# Patient Record
Sex: Male | Born: 1960 | Race: Black or African American
Health system: Southern US, Community
[De-identification: ages and names within clinical notes are randomized; demographics above are authoritative.]

## PROBLEM LIST (undated history)

## (undated) DIAGNOSIS — I10 Essential (primary) hypertension: Secondary | ICD-10-CM

## (undated) DIAGNOSIS — N400 Enlarged prostate without lower urinary tract symptoms: Secondary | ICD-10-CM

## (undated) DIAGNOSIS — E119 Type 2 diabetes mellitus without complications: Secondary | ICD-10-CM

## (undated) DIAGNOSIS — E785 Hyperlipidemia, unspecified: Secondary | ICD-10-CM

## (undated) HISTORY — DX: Essential (primary) hypertension: I10

## (undated) HISTORY — PX: SKIN GRAFT FULL THICKNESS LEG: SUR1299

---

## 2006-07-23 ENCOUNTER — Emergency Department: Payer: Self-pay | Admitting: Emergency Medicine

## 2008-04-06 ENCOUNTER — Emergency Department: Payer: Self-pay | Admitting: Internal Medicine

## 2010-06-14 ENCOUNTER — Emergency Department: Payer: Self-pay | Admitting: Internal Medicine

## 2012-04-03 ENCOUNTER — Emergency Department: Payer: Self-pay | Admitting: Emergency Medicine

## 2013-08-31 ENCOUNTER — Emergency Department: Payer: Self-pay | Admitting: Emergency Medicine

## 2016-07-08 ENCOUNTER — Emergency Department
Admission: EM | Admit: 2016-07-08 | Discharge: 2016-07-08 | Disposition: A | Discharge status: Home / Self Care | Payer: Self-pay | Attending: Student in an Organized Health Care Education/Training Program | Admitting: Student in an Organized Health Care Education/Training Program

## 2016-07-08 ENCOUNTER — Encounter: Payer: Self-pay | Admitting: Emergency Medicine

## 2016-07-08 ENCOUNTER — Emergency Department: Payer: Self-pay

## 2016-07-08 DIAGNOSIS — Y92009 Unspecified place in unspecified non-institutional (private) residence as the place of occurrence of the external cause: Secondary | ICD-10-CM | POA: Insufficient documentation

## 2016-07-08 DIAGNOSIS — S6710XA Crushing injury of unspecified finger(s), initial encounter: Secondary | ICD-10-CM

## 2016-07-08 DIAGNOSIS — S62632A Displaced fracture of distal phalanx of right middle finger, initial encounter for closed fracture: Secondary | ICD-10-CM | POA: Insufficient documentation

## 2016-07-08 DIAGNOSIS — Y9389 Activity, other specified: Secondary | ICD-10-CM | POA: Insufficient documentation

## 2016-07-08 DIAGNOSIS — Z87891 Personal history of nicotine dependence: Secondary | ICD-10-CM | POA: Insufficient documentation

## 2016-07-08 DIAGNOSIS — S62632B Displaced fracture of distal phalanx of right middle finger, initial encounter for open fracture: Secondary | ICD-10-CM

## 2016-07-08 DIAGNOSIS — Y999 Unspecified external cause status: Secondary | ICD-10-CM | POA: Insufficient documentation

## 2016-07-08 DIAGNOSIS — W231XXA Caught, crushed, jammed, or pinched between stationary objects, initial encounter: Secondary | ICD-10-CM | POA: Insufficient documentation

## 2016-07-08 MED ORDER — CEPHALEXIN 500 MG PO CAPS: 500.0000 mg | ORAL_CAPSULE | Freq: Three times a day (TID) | ORAL | 0 refills | Status: DC

## 2016-07-08 MED ORDER — LIDOCAINE HCL (PF) 1 % IJ SOLN
5.0000 mL | Freq: Once | INTRAMUSCULAR | Status: DC
  Filled 2016-07-08: qty 5

## 2016-07-08 MED ORDER — OXYCODONE-ACETAMINOPHEN 5-325 MG PO TABS: 1.0000 | ORAL_TABLET | Freq: Four times a day (QID) | ORAL | 0 refills | Status: AC | PRN

## 2016-07-08 MED ORDER — BUPIVACAINE HCL (PF) 0.5 % IJ SOLN
INTRAMUSCULAR | Status: AC
  Filled 2016-07-08: qty 30

## 2016-07-08 MED ORDER — OXYCODONE-ACETAMINOPHEN 5-325 MG PO TABS
1.0000 | ORAL_TABLET | Freq: Once | ORAL | Status: AC
  Administered 2016-07-08: 1 via ORAL
  Filled 2016-07-08: qty 1

## 2016-07-08 NOTE — Consult Note (Signed)
ORTHOPAEDIC CONSULTATION  REQUESTING PHYSICIAN: Willy EddyPatrick Robinson, MD  Chief Complaint:   Right ring finger injury.  History of Present Illness: Jason Nash is a 56 y.o. male otherwise good health who was helping his friend change the brakes on his friend's vehicle when his finger got stuck while trying to remove a jammed caliper. He presented to the emergency room where x-rays demonstrated a transverse fracture of the distal phalanx just proximal to the tuft with a dorsal laceration. The patient denies any other injuries, and denies any lightheadedness, dizziness, chest pain, shortness breath, or other symptoms that may have precipitated this injury.  History reviewed. No pertinent past medical history. History reviewed. No pertinent surgical history. Social History   Social History  . Marital status: Single    Spouse name: N/A  . Number of children: N/A  . Years of education: N/A   Social History Main Topics  . Smoking status: Former Games developermoker  . Smokeless tobacco: Never Used  . Alcohol use No  . Drug use: No  . Sexual activity: Not Asked   Other Topics Concern  . None   Social History Narrative  . None   History reviewed. No pertinent family history. No Known Allergies Prior to Admission medications   Not on File   Dg Finger Ring Right  Result Date: 07/08/2016 CLINICAL DATA:  Crush injury to right ring finger today. Initial encounter. EXAM: RIGHT RING FINGER 2+V COMPARISON:  None. FINDINGS: A mildly comminuted fracture of the mid-distal aspect of the distal phalanx is noted with 2 mm palmar displacement. There is no evidence of subluxation or dislocation. Tiny radiopaque foreign bodies along the skin or noted. IMPRESSION: Mildly comminuted fracture of the distal phalanx with 2 mm palmar displacement. Electronically Signed   By: Harmon PierJeffrey  Hu M.D.   On: 07/08/2016 13:49    Positive ROS: All other systems have  been reviewed and were otherwise negative with the exception of those mentioned in the HPI and as above.  Physical Exam: General:  Alert, no acute distress Psychiatric:  Patient is competent for consent with normal mood and affect   Cardiovascular:  No pedal edema Respiratory:  No wheezing, non-labored breathing GI:  Abdomen is soft and non-tender Skin:  No lesions in the area of chief complaint Neurologic:  Sensation intact distally Lymphatic:  No axillary or cervical lymphadenopathy  Orthopedic Exam:  Orthopedic examination is limited to the right hand. There is a primarily transverse laceration over the dorsal and dorsal ulnar aspect of the ring finger between the extensor crease and the nailbed measuring approximate 1.5 cm. The nail plate itself is intact, as is the volar surface of the skin. He has good capillary refill to the fingertip and has intact sensation to light touch to the fingertip.  X-rays:  X-rays of the right ring finger are available for review. These films demonstrate a mildly displaced transverse fracture of the midshaft of the distal phalanx. No other bony abnormalities are identified.  Assessment: Open distal phalanx fracture right ring finger.  Plan: After obtaining verbal consent, a digital block was placed sterilely using 5 cc of 0.5% Sensorcaine and 5 cc of 1% lidocaine. The wound was irrigated thoroughly with a Betadine/saline solution before the margins of the wound were debrided sharply. The nail was replaced back beneath the nail fold and the fracture reduced. The laceration was repaired using several 4-0 Prolene interrupted sutures. One additional suture was passed through the nail plate to hold it in place. A sterile  bulky dressing with a dorsal AlumaFoam splint was applied to the ring finger. The patient tolerated the procedure well.  The patient is advised to keep the dressing dry and intact. He is to follow-up in my office in 3-5 days, most likely with my  physician assistant, Jason Latino, PA-C, for a dressing change. Please call [phone] on Monday morning to make this appointment. He is to be provided with pain medication and antibiotics by the emergency room provider.   Thank you for ask me to participate in the care of this most pleasant man. I will be happy to keep you abreast of his progress.    Maryagnes Amos, MD  Beeper #:  [phone]  07/08/2016 3:00 PM

## 2016-07-08 NOTE — ED Notes (Signed)
Pt verbalized understanding of discharge instructions. NAD at this time. 

## 2016-07-08 NOTE — ED Triage Notes (Addendum)
Pt to ED from home c/o right 4th finger pain.  Patient states working on car at home changing brakes when the caliber slipped and crushed finger.  Pt states put pressure at home on finger.  Bleeding under control at this time,  Obvious deformity to finger, laceration to skin on distal knuckle.  Pt able to move other fingers on right hand, slight mobility in injured finger.

## 2016-07-08 NOTE — Discharge Instructions (Addendum)
No strong gripping with the right hand.   Keep splint and dressing dry and intact.  Keep right hand elevated above heart level.  Take the antibiotics regularly as prescribed until they run out. Take extra strength Tylenol or ibuprofen as needed for discomfort. May take prescribed pain medication for more severe pain when necessary. Call the office on Monday morning at [phone] to set up a follow-up appointment in 3-5 days with either myself or my physician assistant, Horris LatinoLance McGhee, PA-C.

## 2016-07-08 NOTE — ED Provider Notes (Signed)
[facility] Emergency Department Provider Note  ____________________________________________  Time seen: Approximately 1:39 PM  I have reviewed the triage vital signs and the nursing notes.   HISTORY  Chief Complaint Finger Injury    HPI Jason Nash is a 56 y.o. male , NAD, presents emergency department for evaluation of right middle finger pain and laceration. Patient states he was working on the brake system of a vehicle earlier today when his finger was crushed and the mechanism. States any as laceration and significant pain and swelling about the distal portion of his right middle finger. Has had some numbness and tingling but is not lost complete sensation of the finger. Also notes that his tetanus vaccination was updated within the last 5 years.   History reviewed. No pertinent past medical history.  There are no active problems to display for this patient.   History reviewed. No pertinent surgical history.  Prior to Admission medications   Medication Sig Start Date End Date Taking? Authorizing Provider  cephALEXin (KEFLEX) 500 MG capsule Take 1 capsule (500 mg total) by mouth 3 (three) times daily. 07/08/16   Jami L Hagler, PA-C  oxyCODONE-acetaminophen (ROXICET) 5-325 MG tablet Take 1 tablet by mouth every 6 (six) hours as needed for severe pain. 07/08/16 07/08/17  Jami L Hagler, PA-C    Allergies Patient has no known allergies.  History reviewed. No pertinent family history.  Social History Social History  Substance Use Topics  . Smoking status: Former Games developermoker  . Smokeless tobacco: Never Used  . Alcohol use No     Review of Systems  Constitutional: No fever, fatigue Musculoskeletal: Positive right middle finger pain. No other phalanx pain nor any hand or wrist pain.  Skin: Positive laceration right middle finger and swelling. Negative for bruising, skin sores. Neurological: Positive numbness and tingling right distal phalanx. No  weakness.  ____________________________________________   PHYSICAL EXAM:  VITAL SIGNS: ED Triage Vitals  Enc Vitals Group     BP 07/08/16 1317 (!) 119/91     Pulse Rate 07/08/16 1317 69     Resp 07/08/16 1317 18     Temp 07/08/16 1317 98.5 F (36.9 C)     Temp Source 07/08/16 1317 Oral     SpO2 07/08/16 1317 98 %     Weight 07/08/16 1318 200 lb (90.7 kg)     Height 07/08/16 1318 5\' 7"  (1.702 m)     Head Circumference --      Peak Flow --      Pain Score 07/08/16 1327 9     Pain Loc --      Pain Edu? --      Excl. in GC? --      Constitutional: Alert and oriented. Well appearing and in no acute distress. Eyes: Conjunctivae are normal.  Head: Atraumatic. Cardiovascular: Good peripheral circulation with 2+ pulses noted in the right upper extremity. Respiratory: Normal respiratory effort without tachypnea or retractions.  Musculoskeletal: Tenderness to palpation about the distal right middle phalanx with deformity noted. Full range of motion of all other fingers of the right hand. Full range of motion of the right wrist without pain or difficulty. Neurologic:  Normal speech and language. No gross focal neurologic deficits are appreciated. Sensation to light touch grossly intact about the digits of the right hand. Skin:  Laceration about the dorsal portion of the distal phalanx of the right middle finger. Nailbed is intact and without laceration to the nail. No visualized or palpated  foreign bodies. Skin is warm, dry.  Psychiatric: Mood and affect are normal. Speech and behavior are normal. Patient exhibits appropriate insight and judgement.   ____________________________________________   LABS  None ____________________________________________  EKG  None ____________________________________________  RADIOLOGY I, Hope Pigeon, personally viewed and evaluated these images (plain radiographs) as part of my medical decision making, as well as reviewing the written report  by the radiologist.  Dg Finger Ring Right  Result Date: 07/08/2016 CLINICAL DATA:  Crush injury to right ring finger today. Initial encounter. EXAM: RIGHT RING FINGER 2+V COMPARISON:  None. FINDINGS: A mildly comminuted fracture of the mid-distal aspect of the distal phalanx is noted with 2 mm palmar displacement. There is no evidence of subluxation or dislocation. Tiny radiopaque foreign bodies along the skin or noted. IMPRESSION: Mildly comminuted fracture of the distal phalanx with 2 mm palmar displacement. Electronically Signed   By: Harmon Pier M.D.   On: 07/08/2016 13:49    ____________________________________________    PROCEDURES  Procedure(s) performed: None   Procedures   Medications  lidocaine (PF) (XYLOCAINE) 1 % injection 5 mL (not administered)  bupivacaine (MARCAINE) 0.5 % injection (not administered)  oxyCODONE-acetaminophen (PERCOCET/ROXICET) 5-325 MG per tablet 1 tablet (1 tablet Oral Given 07/08/16 1408)   ____________________________________________   INITIAL IMPRESSION / ASSESSMENT AND PLAN / ED COURSE  Pertinent labs & imaging results that were available during my care of the patient were reviewed by me and considered in my medical decision making (see chart for details).     Patient's diagnosis is consistent with A nondisplaced fracture of the distal phalanx of the right middle finger due to crushing injury. Once x-rays were uploaded, I spoke with Dr. Joice Lofts in orthopedics in regards to the patient's injury. He suggested to complete a digital block, realign the fracture as well as cleanse the finger well. If any lacerations, they should be repaired. Patient was given a tablet of Percocet and as I was preparing the patient for digital block, Dr. Joice Lofts arrived in the emergency department to evaluate the patient. Dr. Joice Lofts completed all procedures including digital block, thorough irrigation of the wound, resetting of the fracture as well as stitching the  laceration. Patient was also placed in a finger splint for support and will follow up with Dr. Binnie Rail office on Wednesday for further evaluation and treatment. Patient will be discharged home with prescriptions for Roxicet and Keflex to take as directed. Patient is advised to keep the right hand elevated and to keep all dressings on the finger over the next few days. Patient is to keep the wound clean and dry until reevaluated on Wednesday. Patient is given ED precautions to return to the ED for any worsening or new symptoms.    ____________________________________________  FINAL CLINICAL IMPRESSION(S) / ED DIAGNOSES  Final diagnoses:  Open displaced fracture of distal phalanx of right middle finger, initial encounter  Crushing injury of finger, initial encounter      NEW MEDICATIONS STARTED DURING THIS VISIT:  Discharge Medication List as of 07/08/2016  3:03 PM    START taking these medications   Details  cephALEXin (KEFLEX) 500 MG capsule Take 1 capsule (500 mg total) by mouth 3 (three) times daily., Starting Sat 07/08/2016, Print    oxyCODONE-acetaminophen (ROXICET) 5-325 MG tablet Take 1 tablet by mouth every 6 (six) hours as needed for severe pain., Starting Sat 07/08/2016, Until Sun 07/08/2017, Print             Jami Jon Gills, PA-C  07/08/16 1547    Willy Eddy, MD 07/08/16 1550

## 2016-12-19 ENCOUNTER — Emergency Department
Admission: EM | Admit: 2016-12-19 | Discharge: 2016-12-19 | Disposition: A | Discharge status: Home / Self Care | Payer: Self-pay | Attending: Emergency Medicine | Admitting: Emergency Medicine

## 2016-12-19 ENCOUNTER — Encounter: Payer: Self-pay | Admitting: Emergency Medicine

## 2016-12-19 DIAGNOSIS — Z87891 Personal history of nicotine dependence: Secondary | ICD-10-CM | POA: Insufficient documentation

## 2016-12-19 DIAGNOSIS — K529 Noninfective gastroenteritis and colitis, unspecified: Secondary | ICD-10-CM | POA: Insufficient documentation

## 2016-12-19 LAB — COMPREHENSIVE METABOLIC PANEL
ALBUMIN: 4.5 g/dL (ref 3.5–5.0)
ALK PHOS: 103 U/L (ref 38–126)
ALT: 22 U/L (ref 17–63)
ANION GAP: 7 (ref 5–15)
AST: 25 U/L (ref 15–41)
BILIRUBIN TOTAL: 1.1 mg/dL (ref 0.3–1.2)
BUN: 21 mg/dL — ABNORMAL HIGH (ref 6–20)
CALCIUM: 9.3 mg/dL (ref 8.9–10.3)
CO2: 26 mmol/L (ref 22–32)
Chloride: 105 mmol/L (ref 101–111)
Creatinine, Ser: 1.18 mg/dL (ref 0.61–1.24)
GFR calc non Af Amer: >60 mL/min (ref >60)
GLUCOSE: 116 mg/dL — AB (ref 65–99)
POTASSIUM: 4.2 mmol/L (ref 3.5–5.1)
SODIUM: 138 mmol/L (ref 135–145)
TOTAL PROTEIN: 8.3 g/dL — AB (ref 6.5–8.1)

## 2016-12-19 LAB — URINALYSIS, COMPLETE (UACMP) WITH MICROSCOPIC
BACTERIA UA: NONE SEEN
BILIRUBIN URINE: NEGATIVE
GLUCOSE, UA: NEGATIVE mg/dL
HGB URINE DIPSTICK: NEGATIVE
KETONES UR: NEGATIVE mg/dL
Nitrite: NEGATIVE
PH: 5 (ref 5.0–8.0)
PROTEIN: NEGATIVE mg/dL
Specific Gravity, Urine: 1.032 — ABNORMAL HIGH (ref 1.005–1.030)

## 2016-12-19 LAB — CBC
HEMATOCRIT: 46.2 % (ref 40.0–52.0)
Hemoglobin: 15.3 g/dL (ref 13.0–18.0)
MCH: 28.7 pg (ref 26.0–34.0)
MCHC: 33.1 g/dL (ref 32.0–36.0)
MCV: 86.9 fL (ref 80.0–100.0)
Platelets: 230 10*3/uL (ref 150–440)
RBC: 5.32 MIL/uL (ref 4.40–5.90)
RDW: 14.2 % (ref 11.5–14.5)
WBC: 9.5 10*3/uL (ref 3.8–10.6)

## 2016-12-19 LAB — LIPASE, BLOOD: Lipase: 22 U/L (ref 11–51)

## 2016-12-19 MED ORDER — LOPERAMIDE HCL 2 MG PO TABS: 2.0000 mg | ORAL_TABLET | Freq: Four times a day (QID) | ORAL | 0 refills | Status: DC | PRN

## 2016-12-19 MED ORDER — ONDANSETRON 4 MG PO TBDP: 4.0000 mg | ORAL_TABLET | Freq: Three times a day (TID) | ORAL | 0 refills | Status: DC | PRN

## 2016-12-19 NOTE — ED Notes (Signed)
AAOx3.  Skin warm and dry.  NAD 

## 2016-12-19 NOTE — ED Triage Notes (Signed)
Pt ambulatory to triage with steady gait, no distress noted. Pt reports emesis and diarrhea since 1800 yesterday afternoon. Per pt he consumed a chicken sandwich from neighbors house for lunch.

## 2016-12-19 NOTE — ED Provider Notes (Signed)
[facility] Emergency Department Provider Note   ____________________________________________    I have reviewed the triage vital signs and the nursing notes.   HISTORY  Chief Complaint Emesis and Diarrhea     HPI Jason Nash is a 56 y.o. male is a 56 y.o. male who presents with diarrhea and nausea and vomiting. He reports the symptoms started overnight and have continued through this morning. He is unclear what causes. Does report that his wife was sick last week with similar symptoms. He denies fevers but has had body aches. Mild abdominal cramping but none currently. No recent travel.   History reviewed. No pertinent past medical history.  There are no active problems to display for this patient.   History reviewed. No pertinent surgical history.  Prior to Admission medications   Medication Sig Start Date End Date Taking? Authorizing Provider  cephALEXin (KEFLEX) 500 MG capsule Take 1 capsule (500 mg total) by mouth 3 (three) times daily. 07/08/16   Hagler, Jami L, PA-C  loperamide (IMODIUM A-D) 2 MG tablet Take 1 tablet (2 mg total) by mouth 4 (four) times daily as needed for diarrhea or loose stools. 12/19/16   Jene Every, MD  ondansetron (ZOFRAN ODT) 4 MG disintegrating tablet Take 1 tablet (4 mg total) by mouth every 8 (eight) hours as needed for nausea or vomiting. 12/19/16   Jene Every, MD  oxyCODONE-acetaminophen (ROXICET) 5-325 MG tablet Take 1 tablet by mouth every 6 (six) hours as needed for severe pain. 07/08/16 07/08/17  Hagler, Jami L, PA-C     Allergies Patient has no known allergies.  History reviewed. No pertinent family history.  Social History Social History  Substance Use Topics  . Smoking status: Former Games developer  . Smokeless tobacco: Never Used  . Alcohol use No    Review of Systems  Constitutional: No fever/chills Eyes: No visual changes.  ENT: No sore throat. Cardiovascular: Denies chest  pain. Respiratory: Denies shortness of breath. Gastrointestinal: As above Genitourinary: Negative for dysuria. Musculoskeletal: Negative for back pain. Skin: Negative for rash. Neurological: Negative for headaches or weakness   ____________________________________________   PHYSICAL EXAM:  VITAL SIGNS: ED Triage Vitals [12/19/16 0650]  Enc Vitals Group     BP (!) 179/96     Pulse Rate 73     Resp 16     Temp 97.8 F (36.6 C)     Temp Source Oral     SpO2 99 %     Weight 90.7 kg (200 lb)     Height      Head Circumference      Peak Flow      Pain Score      Pain Loc      Pain Edu?      Excl. in GC?     Constitutional: Alert and oriented. No acute distress. Pleasant and interactive Eyes: Conjunctivae are normal.  Head: Atraumatic. Nose: No congestion/rhinnorhea. Mouth/Throat: Mucous membranes are moist.   Neck:  Painless ROM Cardiovascular: Normal rate, regular rhythm. Grossly normal heart sounds.  Good peripheral circulation. Respiratory: Normal respiratory effort.  No retractions. Lungs CTAB. Gastrointestinal: Soft and nontender. No distention.  No CVA tenderness. Genitourinary: deferred Musculoskeletal: No lower extremity tenderness nor edema.  Warm and well perfused Neurologic:  Normal speech and language. No gross focal neurologic deficits are appreciated.  Skin:  Skin is warm, dry and intact. No rash noted. Psychiatric: Mood and affect are normal. Speech and behavior are normal.  ____________________________________________  LABS (all labs ordered are listed, but only abnormal results are displayed)  Labs Reviewed  COMPREHENSIVE METABOLIC PANEL - Abnormal; Notable for the following:       Result Value   Glucose, Bld 116 (*)    BUN 21 (*)    Total Protein 8.3 (*)    All other components within normal limits  URINALYSIS, COMPLETE (UACMP) WITH MICROSCOPIC - Abnormal; Notable for the following:    Color, Urine YELLOW (*)    APPearance HAZY (*)     Specific Gravity, Urine 1.032 (*)    Leukocytes, UA TRACE (*)    Squamous Epithelial / LPF 0-5 (*)    All other components within normal limits  LIPASE, BLOOD  CBC   ____________________________________________  EKG  None ____________________________________________  RADIOLOGY  None ____________________________________________   PROCEDURES  Procedure(s) performed: No    Critical Care performed: No ____________________________________________   INITIAL IMPRESSION / ASSESSMENT AND PLAN / ED COURSE  Pertinent labs & imaging results that were available during my care of the patient were reviewed by me and considered in my medical decision making (see chart for details).  Patient well-appearing and in no acute distress. Vital signs are unremarkable. Lab work is reassuring. Recommend continue hydration, symptomatic treatment for likely viral gastroenteritis    ____________________________________________   FINAL CLINICAL IMPRESSION(S) / ED DIAGNOSES  Final diagnoses:  Gastroenteritis      NEW MEDICATIONS STARTED DURING THIS VISIT:  Discharge Medication List as of 12/19/2016  9:20 AM    START taking these medications   Details  loperamide (IMODIUM A-D) 2 MG tablet Take 1 tablet (2 mg total) by mouth 4 (four) times daily as needed for diarrhea or loose stools., Starting Tue 12/19/2016, Print    ondansetron (ZOFRAN ODT) 4 MG disintegrating tablet Take 1 tablet (4 mg total) by mouth every 8 (eight) hours as needed for nausea or vomiting., Starting Tue 12/19/2016, Print         Note:  This document was prepared using Dragon voice recognition software and may include unintentional dictation errors.    Jene EveryKinner, Blasa Raisch, MD 12/19/16 [phone]

## 2017-11-03 ENCOUNTER — Other Ambulatory Visit: Payer: Self-pay

## 2017-11-03 ENCOUNTER — Emergency Department
Admission: EM | Admit: 2017-11-03 | Discharge: 2017-11-03 | Disposition: A | Discharge status: Home / Self Care | Payer: Self-pay | Attending: Emergency Medicine | Admitting: Emergency Medicine

## 2017-11-03 ENCOUNTER — Encounter: Payer: Self-pay | Admitting: Physician Assistant

## 2017-11-03 DIAGNOSIS — Y929 Unspecified place or not applicable: Secondary | ICD-10-CM | POA: Insufficient documentation

## 2017-11-03 DIAGNOSIS — Z87891 Personal history of nicotine dependence: Secondary | ICD-10-CM | POA: Insufficient documentation

## 2017-11-03 DIAGNOSIS — S0006XA Insect bite (nonvenomous) of scalp, initial encounter: Secondary | ICD-10-CM | POA: Insufficient documentation

## 2017-11-03 DIAGNOSIS — W57XXXA Bitten or stung by nonvenomous insect and other nonvenomous arthropods, initial encounter: Secondary | ICD-10-CM | POA: Insufficient documentation

## 2017-11-03 DIAGNOSIS — Y939 Activity, unspecified: Secondary | ICD-10-CM | POA: Insufficient documentation

## 2017-11-03 DIAGNOSIS — Y998 Other external cause status: Secondary | ICD-10-CM | POA: Insufficient documentation

## 2017-11-03 MED ORDER — CEPHALEXIN 500 MG PO CAPS
500.0000 mg | ORAL_CAPSULE | Freq: Once | ORAL | Status: AC
  Administered 2017-11-03: 500 mg via ORAL
  Filled 2017-11-03: qty 1

## 2017-11-03 MED ORDER — CEPHALEXIN 500 MG PO CAPS: 500.0000 mg | ORAL_CAPSULE | Freq: Three times a day (TID) | ORAL | 0 refills | Status: DC

## 2017-11-03 NOTE — ED Notes (Signed)
Patient thinks he was bitten by a spider on the top of his head. Believes it occurred on Monday. Has been using alcohol on it since then without improvement.

## 2017-11-03 NOTE — ED Notes (Signed)
Patient with small area to top of scalp that he thinks is an insect bit. Patient states that he noticed Monday. Patient states that it has become larger and more tender since Monday.

## 2017-11-03 NOTE — ED Provider Notes (Signed)
[name] Emergency Department Provider Note ____________________________________________  Time seen: 2245  I have reviewed the triage vital signs and the nursing notes.  HISTORY  Chief Complaint  Insect Bite  HPI Jason Nash is a 57 y.o. male presents to the ED accompanied by his wife, for evaluation of a tender, swollen area to the posterior scalp.  Patient describes on Monday, after work, he began to experience some tenderness and swelling to the scalp.  He is unclear of what insect might have bitten or stung him.  He reports since that time he had a focal area of swelling and tightness to the scalp.  He also reports a scab in the same area.  No spontaneous drainage is reported.  Patient also denies any interim fevers, chills, sweats.  History reviewed. No pertinent past medical history.  There are no active problems to display for this patient.  History reviewed. No pertinent surgical history.  Prior to Admission medications   Medication Sig Start Date End Date Taking? Authorizing Provider  cephALEXin (KEFLEX) 500 MG capsule Take 1 capsule (500 mg total) by mouth 3 (three) times daily. 11/03/17   Dafney Farler, Charlesetta Ivory, PA-C    Allergies Patient has no known allergies.  No family history on file.  Social History Social History   Tobacco Use  . Smoking status: Former Games developer  . Smokeless tobacco: Never Used  Substance Use Topics  . Alcohol use: No  . Drug use: No    Review of Systems  Constitutional: Negative for fever. Eyes: Negative for visual changes. ENT: Negative for sore throat. Cardiovascular: Negative for chest pain. Respiratory: Negative for shortness of breath. Gastrointestinal: Negative for abdominal pain, vomiting and diarrhea. Musculoskeletal: Negative for back pain. Skin: Negative for rash. Scalp insect bite Neurological: Negative for headaches, focal weakness or  numbness. ____________________________________________  PHYSICAL EXAM:  VITAL SIGNS: ED Triage Vitals  Enc Vitals Group     BP 11/03/17 2049 (!) 169/91     Pulse Rate 11/03/17 2049 92     Resp 11/03/17 2049 20     Temp 11/03/17 2049 98.3 F (36.8 C)     Temp Source 11/03/17 2049 Oral     SpO2 11/03/17 2049 97 %     Weight 11/03/17 2048 200 lb (90.7 kg)     Height 11/03/17 2048  (1.753 m)     Head Circumference --      Peak Flow --      Pain Score 11/03/17 2302 0     Pain Loc --      Pain Edu? --      Excl. in GC? --     Constitutional: Alert and oriented. Well appearing and in no distress. Head: Normocephalic and atraumatic.  Patient with a focal area of induration and pointing, surrounding a small central scab.  The scab was lifted with an alcohol swab to reveal a scant amount of purulent drainage. Eyes: Conjunctivae are normal. PERRL. Normal extraocular movements Neck: Supple. No thyromegaly. Hematological/Lymphatic/Immunological: No cervical lymphadenopathy. Cardiovascular: Normal rate, regular rhythm. Normal distal pulses. Respiratory: Normal respiratory effort. No wheezes/rales/rhonchi. Musculoskeletal: Nontender with normal range of motion in all extremities.  Neurologic:  Normal gait without ataxia. Normal speech and language. No gross focal neurologic deficits are appreciated. Skin:  Skin is warm, dry and intact. No rash noted. Psychiatric: Mood and affect are normal. Patient exhibits appropriate insight and judgment. ___________________________________________  PROCEDURES  Procedures Keflex 500 mg PO ____________________________________________  INITIAL IMPRESSION /  ASSESSMENT AND PLAN / ED COURSE  Patient with ED evaluation of a presumed insect bite to the scalp.  Patient presents with some local signs of infection.  He will be discharged with a prescription for Keflex to dose as directed.  He is encouraged to keep the area clean, dry, and cover with a  small amount of antibiotic ointment.  Return precautions have been reviewed.  He and his wife attempted to ask questions and are comfortable with discharge plan. ____________________________________________  FINAL CLINICAL IMPRESSION(S) / ED DIAGNOSES  Final diagnoses:  Insect bite of scalp, initial encounter  Bug bite with infection, initial encounter      Lissa Hoard, PA-C 11/03/17 2334    Jeanmarie Plant, MD 11/03/17 2358

## 2017-11-03 NOTE — ED Triage Notes (Signed)
Reports has area on scalp thinks possible spider bite, happened Monday.

## 2017-11-03 NOTE — Discharge Instructions (Addendum)
You are being treated for a minor infection to the scalp after an insect bite. There is no signs of a serious infection. Take the antibiotic as directed. Keep the scalp clean and covered with a small amount of antibiotic ointment.

## 2020-02-09 ENCOUNTER — Ambulatory Visit: Payer: Self-pay | Attending: Internal Medicine

## 2020-02-09 DIAGNOSIS — Z23 Encounter for immunization: Secondary | ICD-10-CM

## 2020-02-09 NOTE — Progress Notes (Signed)
   Covid-19 Vaccination Clinic  Name:  Jason Nash    MRN: [id number] DOB: [birth year]  02/09/2020  Mr. Tagg was observed post Covid-19 immunization for 15 minutes without incident. He was provided with Vaccine Information Sheet and instruction to access the V-Safe system.   Mr. Luffman was instructed to call 911 with any severe reactions post vaccine: Marland Kitchen Difficulty breathing  . Swelling of face and throat  . A fast heartbeat  . A bad rash all over body  . Dizziness and weakness   Immunizations Administered    Name Date Dose VIS Date Route   Pfizer COVID-19 Vaccine 02/09/2020  4:25 PM 0.3 mL 08/06/2018 Intramuscular   Manufacturer: [employer], [employer]   Lot: [id number]   NDC: [address]-4

## 2020-03-01 ENCOUNTER — Ambulatory Visit: Payer: Self-pay | Attending: Internal Medicine

## 2020-03-01 DIAGNOSIS — Z23 Encounter for immunization: Secondary | ICD-10-CM

## 2020-03-01 NOTE — Progress Notes (Signed)
   Covid-19 Vaccination Clinic  Name:  Jason Nash    MRN: [id number] DOB: 1961/02/24  03/01/2020  Mr. Jason Nash was observed post Covid-19 immunization for 15 minutes without incident. He was provided with Vaccine Information Sheet and instruction to access the V-Safe system.   Mr. Jason Nash was instructed to call 911 with any severe reactions post vaccine: Marland Kitchen Difficulty breathing  . Swelling of face and throat  . A fast heartbeat  . A bad rash all over body  . Dizziness and weakness   Immunizations Administered    Name Date Dose VIS Date Route   Pfizer COVID-19 Vaccine 03/01/2020  4:17 PM 0.3 mL 08/06/2018 Intramuscular   Manufacturer: [employer], [employer]   Lot: [id number]   NDC: [address]-7

## 2020-06-18 ENCOUNTER — Other Ambulatory Visit: Payer: Self-pay

## 2020-06-18 ENCOUNTER — Emergency Department
Admission: EM | Admit: 2020-06-18 | Discharge: 2020-06-18 | Disposition: A | Discharge status: Home / Self Care | Payer: Self-pay | Attending: Emergency Medicine | Admitting: Emergency Medicine

## 2020-06-18 ENCOUNTER — Emergency Department: Payer: Self-pay

## 2020-06-18 DIAGNOSIS — R1030 Lower abdominal pain, unspecified: Secondary | ICD-10-CM | POA: Insufficient documentation

## 2020-06-18 DIAGNOSIS — Z87891 Personal history of nicotine dependence: Secondary | ICD-10-CM | POA: Insufficient documentation

## 2020-06-18 DIAGNOSIS — N400 Enlarged prostate without lower urinary tract symptoms: Secondary | ICD-10-CM

## 2020-06-18 DIAGNOSIS — N401 Enlarged prostate with lower urinary tract symptoms: Secondary | ICD-10-CM | POA: Insufficient documentation

## 2020-06-18 LAB — URINALYSIS, COMPLETE (UACMP) WITH MICROSCOPIC
Bacteria, UA: NONE SEEN
Bilirubin Urine: NEGATIVE
Glucose, UA: NEGATIVE mg/dL
Ketones, ur: NEGATIVE mg/dL
Nitrite: NEGATIVE
Protein, ur: NEGATIVE mg/dL
Specific Gravity, Urine: 1.028 (ref 1.005–1.030)
pH: 5 (ref 5.0–8.0)

## 2020-06-18 LAB — COMPREHENSIVE METABOLIC PANEL
ALT: 21 U/L (ref 0–44)
AST: 21 U/L (ref 15–41)
Albumin: 4.1 g/dL (ref 3.5–5.0)
Alkaline Phosphatase: 104 U/L (ref 38–126)
Anion gap: 8 (ref 5–15)
BUN: 18 mg/dL (ref 6–20)
CO2: 25 mmol/L (ref 22–32)
Calcium: 8.9 mg/dL (ref 8.9–10.3)
Chloride: 104 mmol/L (ref 98–111)
Creatinine, Ser: 1.05 mg/dL (ref 0.61–1.24)
GFR, Estimated: >60 mL/min (ref >60)
Glucose, Bld: 94 mg/dL (ref 70–99)
Potassium: 4.3 mmol/L (ref 3.5–5.1)
Sodium: 137 mmol/L (ref 135–145)
Total Bilirubin: 0.8 mg/dL (ref 0.3–1.2)
Total Protein: 7.9 g/dL (ref 6.5–8.1)

## 2020-06-18 LAB — CBC
HCT: 42.9 % (ref 39.0–52.0)
Hemoglobin: 14 g/dL (ref 13.0–17.0)
MCH: 28.5 pg (ref 26.0–34.0)
MCHC: 32.6 g/dL (ref 30.0–36.0)
MCV: 87.2 fL (ref 80.0–100.0)
Platelets: 235 10*3/uL (ref 150–400)
RBC: 4.92 MIL/uL (ref 4.22–5.81)
RDW: 13.2 % (ref 11.5–15.5)
WBC: 7.8 10*3/uL (ref 4.0–10.5)
nRBC: 0 % (ref 0.0–0.2)

## 2020-06-18 LAB — CK: Total CK: 311 U/L (ref 49–397)

## 2020-06-18 LAB — LACTIC ACID, PLASMA: Lactic Acid, Venous: 0.9 mmol/L (ref 0.5–1.9)

## 2020-06-18 LAB — LIPASE, BLOOD: Lipase: 22 U/L (ref 11–51)

## 2020-06-18 MED ORDER — HYDROMORPHONE HCL 1 MG/ML IJ SOLN
0.5000 mg | Freq: Once | INTRAMUSCULAR | Status: AC
  Administered 2020-06-18: 0.5 mg via INTRAVENOUS
  Filled 2020-06-18: qty 1

## 2020-06-18 MED ORDER — ONDANSETRON HCL 4 MG/2ML IJ SOLN
4.0000 mg | Freq: Once | INTRAMUSCULAR | Status: AC
  Administered 2020-06-18: 4 mg via INTRAVENOUS
  Filled 2020-06-18: qty 2

## 2020-06-18 MED ORDER — IOHEXOL 350 MG/ML SOLN
100.0000 mL | Freq: Once | INTRAVENOUS | Status: AC | PRN
  Administered 2020-06-18: 100 mL via INTRAVENOUS
  Filled 2020-06-18: qty 100

## 2020-06-18 NOTE — Discharge Instructions (Addendum)
Take Tylenol 1 g every 8 hours and ibuprofen 600 every 6 hours. Return to the ER if you develop worsening symptoms.  You to follow-up with your primary care doctor for your prostate to be started on medications IMPRESSION:  1. No acute abnormality.  2. Mild diffuse bladder wall thickening, compatible with chronic  bladder outlet obstruction by the mildly enlarged prostate gland.  3. Mild colonic diverticulosis.

## 2020-06-18 NOTE — ED Provider Notes (Signed)
[facility] Emergency Department Provider Note  ____________________________________________   Event Date/Time   First MD Initiated Contact with Patient 06/18/20 1428     (approximate)  I have reviewed the triage vital signs and the nursing notes.   HISTORY  Chief Complaint Abdominal Pain    HPI Jason Nash is a 60 y.o. male who is otherwise healthy comes in for abdominal pain.  Patient reports 3 days of abdominal pain most in the lower abdomen and goes up into the right upper abdomen.  Pain is severe, constant, nothing makes it better, nothing makes it worse.  Denies ever having abdominal surgeries before.  States that he had 1 episode of loose stool today.  He denies any chest pain, shortness of breath.  He reports some cramping in his legs bilaterally.           History reviewed. No pertinent past medical history.  There are no problems to display for this patient.   History reviewed. No pertinent surgical history.  Prior to Admission medications   Medication Sig Start Date End Date Taking? Authorizing Provider  cephALEXin (KEFLEX) 500 MG capsule Take 1 capsule (500 mg total) by mouth 3 (three) times daily. 11/03/17   Menshew, Dannielle Karvonen, PA-C    Allergies Patient has no known allergies.  No family history on file.  Social History Social History   Tobacco Use  . Smoking status: Former Research scientist (life sciences)  . Smokeless tobacco: Never Used  Substance Use Topics  . Alcohol use: No  . Drug use: No      Review of Systems Constitutional: Positive chills Eyes: No visual changes. ENT: No sore throat. Cardiovascular: Denies chest pain. Respiratory: Denies shortness of breath. Gastrointestinal: Positive abdominal pain, nausea , episode of loose stool Genitourinary: Negative for dysuria. Musculoskeletal: Negative for back pain. Skin: Negative for rash. Neurological: Negative for headaches, focal weakness or numbness. All other ROS  negative ____________________________________________   PHYSICAL EXAM:  VITAL SIGNS: ED Triage Vitals  Enc Vitals Group     BP 06/18/20 1333 (!) 155/91     Pulse Rate 06/18/20 1332 85     Resp 06/18/20 1332 17     Temp 06/18/20 1332 99.9 F (37.7 C)     Temp Source 06/18/20 1332 Oral     SpO2 06/18/20 1332 99 %     Weight 06/18/20 1333 210 lb (95.3 kg)     Height 06/18/20 1333 5\' 7"  (1.702 m)     Head Circumference --      Peak Flow --      Pain Score 06/18/20 1333 8     Pain Loc --      Pain Edu? --      Excl. in [place]? --     Constitutional: Alert and oriented. Well appearing and in no acute distress. Eyes: Conjunctivae are normal. EOMI. Head: Atraumatic. Nose: No congestion/rhinnorhea. Mouth/Throat: Mucous membranes are moist.   Neck: No stridor. Trachea Midline. FROM Cardiovascular: Normal rate, regular rhythm. Grossly normal heart sounds.  Good peripheral circulation. Respiratory: Normal respiratory effort.  No retractions. Lungs CTAB. Gastrointestinal: Tender mostly in his lower abdomen but also in his right upper quadrant.  No distention. No abdominal bruits.  Musculoskeletal: No lower extremity tenderness nor edema.  No joint effusions. Equal strength in his legs. No discoloration noted. Warm well perfused. Neurologic:  Normal speech and language. No gross focal neurologic deficits are appreciated.  Skin:  Skin is warm, dry and intact. No rash  noted. Psychiatric: Mood and affect are normal. Speech and behavior are normal. GU: Deferred   ____________________________________________   LABS (all labs ordered are listed, but only abnormal results are displayed)  Labs Reviewed  URINALYSIS, COMPLETE (UACMP) WITH MICROSCOPIC - Abnormal; Notable for the following components:      Result Value   Color, Urine YELLOW (*)    APPearance CLEAR (*)    Hgb urine dipstick SMALL (*)    Leukocytes,Ua TRACE (*)    All other components within normal limits  LIPASE, BLOOD   COMPREHENSIVE METABOLIC PANEL  CBC  LACTIC ACID, PLASMA  LACTIC ACID, PLASMA   ____________________________________________  RADIOLOGY   Official radiology report(s): CT ABDOMEN PELVIS W CONTRAST  Result Date: 06/18/2020 CLINICAL DATA:  Right upper quadrant abdominal pain radiating to the back with diarrhea for the past 3 days. EXAM: CT ABDOMEN AND PELVIS WITH CONTRAST TECHNIQUE: Multidetector CT imaging of the abdomen and pelvis was performed using the standard protocol following bolus administration of intravenous contrast. CONTRAST:  OMNIPAQUE IOHEXOL 350 MG/ML SOLN COMPARISON:  None. FINDINGS: Lower chest: Minimal bilateral dependent atelectasis. Mildly enlarged heart. Hepatobiliary: No focal liver abnormality is seen. No gallstones, gallbladder wall thickening, or biliary dilatation. Pancreas: Unremarkable. No pancreatic ductal dilatation or surrounding inflammatory changes. Spleen: Normal in size without focal abnormality. Adrenals/Urinary Tract: Mild diffuse bladder wall thickening. Small cyst in each kidney. Normal appearing adrenal glands and ureters. Stomach/Bowel: Small number of colon diverticula without evidence of diverticulitis. Normal appearing appendix, stomach and small bowel. Vascular/Lymphatic: Mild atheromatous arterial calcifications. No enlarged lymph nodes. Reproductive: Minimally enlarged prostate gland. Other: No abdominal wall hernia or abnormality. No abdominopelvic ascites. Musculoskeletal: Lumbar and lower thoracic spine degenerative changes. IMPRESSION: 1. No acute abnormality. 2. Mild diffuse bladder wall thickening, compatible with chronic bladder outlet obstruction by the mildly enlarged prostate gland. 3. Mild colonic diverticulosis. Electronically Signed   By: Beckie Salts M.D.   On: 06/18/2020 15:46    ____________________________________________   PROCEDURES  Procedure(s) performed (including Critical  Care):  Procedures   ____________________________________________   INITIAL IMPRESSION / ASSESSMENT AND PLAN / ED COURSE  Jason Nash was evaluated in Emergency Department on 06/18/2020 for the symptoms described in the history of present illness. He was evaluated in the context of the global COVID-19 pandemic, which necessitated consideration that the patient might be at risk for infection with the SARS-CoV-2 virus that causes COVID-19. Institutional protocols and algorithms that pertain to the evaluation of patients at risk for COVID-19 are in a state of rapid change based on information released by regulatory bodies including the CDC and federal and state organizations. These policies and algorithms were followed during the patient's care in the ED.     Patient is a 60 year old who comes in with some low-grade temperatures at 37.7 with abdominal pain.  Will get CT scan to evaluate for appendicitis, cholecystitis, diverticulitis, SBO.  Not sure what to make of his bilateral leg cramping.  There is no swelling or rash on his legs..  Have good strength.  They are warm and well-perfused.   He has no injury to suggest fracture.  We will add on a CK to make sure no evidence of rhabdo.  Labs are reassuring.  Lactate is normal.  CK is negative.  No white count elevation.  CT scan was negative except concerns for BPH and some obstruction.  We will do a postvoid bladder scan.  Discussed with patient he had COVID back on Christmas.  He  denies any shortness of breath to suggest an infection in his lungs.  Denies any skin source of infection.  No midline back pain to suggest back pain infection.  Unclear his low-grade temperature at this time but suspect more likely viral infection.  Patient is otherwise very well-appearing.  Reevaluated patient is feeling much better.  Post void bladder scan is around 100-200 cc. At this time I do not think he needs a catheter. Did discuss with patient that if he does  not get this followed up with over time he may need a Foley. Patient expressed understanding.   At this time patient's had some low-grade temperatures but otherwise does not meet any sepsis criteria. He is in a normal lactate. He is very well-appearing. He feels uncomfortable with going home and will continue to take Tylenol and ibuprofen for pain and can return to the ER if his symptoms are worsening.     ____________________________________________   FINAL CLINICAL IMPRESSION(S) / ED DIAGNOSES   Final diagnoses:  Enlarged prostate  Lower abdominal pain      MEDICATIONS GIVEN DURING THIS VISIT:  Medications  HYDROmorphone (DILAUDID) injection 0.5 mg (0.5 mg Intravenous Given 06/18/20 1521)  ondansetron (ZOFRAN) injection 4 mg (4 mg Intravenous Given 06/18/20 1521)  iohexol (OMNIPAQUE) 350 MG/ML injection 100 mL (100 mLs Intravenous Contrast Given 06/18/20 1529)     ED Discharge Orders    None       Note:  This document was prepared using Dragon voice recognition software and may include unintentional dictation errors.   Concha Se, MD 06/18/20 [phone]

## 2020-06-18 NOTE — ED Triage Notes (Signed)
Pt c/o RUQ pain that radiates into the back with diarrhea for the past 3 days and also c/o BL knee pain. Pt is in NAD. Ambulatory to triage with no distress noted

## 2020-06-20 LAB — URINE CULTURE: Culture: NO GROWTH

## 2020-12-07 ENCOUNTER — Other Ambulatory Visit: Payer: Self-pay

## 2020-12-07 ENCOUNTER — Emergency Department
Admission: EM | Admit: 2020-12-07 | Discharge: 2020-12-07 | Disposition: A | Discharge status: Home / Self Care | Payer: Self-pay | Attending: Emergency Medicine | Admitting: Emergency Medicine

## 2020-12-07 DIAGNOSIS — K644 Residual hemorrhoidal skin tags: Secondary | ICD-10-CM | POA: Insufficient documentation

## 2020-12-07 DIAGNOSIS — Z87891 Personal history of nicotine dependence: Secondary | ICD-10-CM | POA: Insufficient documentation

## 2020-12-07 DIAGNOSIS — I10 Essential (primary) hypertension: Secondary | ICD-10-CM | POA: Insufficient documentation

## 2020-12-07 MED ORDER — LIDOCAINE HCL URETHRAL/MUCOSAL 2 % EX GEL
1.0000 "application " | Freq: Once | CUTANEOUS | Status: AC
  Administered 2020-12-07: 1
  Filled 2020-12-07: qty 5

## 2020-12-07 MED ORDER — HYDROCORTISONE ACETATE 25 MG RE SUPP: 25.0000 mg | Freq: Two times a day (BID) | RECTAL | 1 refills | Status: DC

## 2020-12-07 MED ORDER — DOCUSATE SODIUM 100 MG PO CAPS: 100.0000 mg | ORAL_CAPSULE | Freq: Two times a day (BID) | ORAL | 2 refills | Status: DC

## 2020-12-07 NOTE — ED Notes (Signed)
See triage note  Presents with possible hemorrhoids   States he noticed pain and some blood yesterday  No relief with OTC meds

## 2020-12-07 NOTE — ED Provider Notes (Signed)
[facility] Emergency Department Provider Note   ____________________________________________   Event Date/Time   First MD Initiated Contact with Patient 12/07/20 1238     (approximate)  I have reviewed the triage vital signs and the nursing notes.   HISTORY  Chief Complaint Hemorrhoids    HPI JAXX HUISH is a 60 y.o. male patient presents with protruding hemorrhoid 2 days.  Patient that this is intermitting problem and he normally can reduce it manually.  Patient also complain about constipation.  Denies rectal bleeding.  Rates pain as a 9/10.  Described pain as "sore".  No palliative measures for complaint.         Past Medical History:  Diagnosis Date   Hypertension     There are no problems to display for this patient.   No past surgical history on file.  Prior to Admission medications   Medication Sig Start Date End Date Taking? Authorizing Provider  docusate sodium (COLACE) 100 MG capsule Take 1 capsule (100 mg total) by mouth 2 (two) times daily. 12/07/20 12/07/21 Yes Joni Reining, PA-C  hydrocortisone (ANUSOL-HC) 25 MG suppository Place 1 suppository (25 mg total) rectally every 12 (twelve) hours. 12/07/20 12/07/21 Yes Joni Reining, PA-C  cephALEXin (KEFLEX) 500 MG capsule Take 1 capsule (500 mg total) by mouth 3 (three) times daily. 11/03/17   Menshew, Charlesetta Ivory, PA-C    Allergies Patient has no known allergies.  No family history on file.  Social History Social History   Tobacco Use   Smoking status: Former    Pack years: 0.00   Smokeless tobacco: Never  Substance Use Topics   Alcohol use: No   Drug use: No    Review of Systems  Constitutional: No fever/chills Eyes: No visual changes. ENT: No sore throat. Cardiovascular: Denies chest pain. Respiratory: Denies shortness of breath. Gastrointestinal: No abdominal pain.  No nausea, no vomiting.  No diarrhea.  No constipation.  Hemorrhoid Genitourinary:  Negative for dysuria. Musculoskeletal: Negative for back pain. Skin: Negative for rash. Neurological: Negative for headaches, focal weakness or numbness. Endocrine: Hypertension  ____________________________________________   PHYSICAL EXAM:  VITAL SIGNS: ED Triage Vitals [12/07/20 1120]  Enc Vitals Group     BP (!) 164/85     Pulse Rate 73     Resp 18     Temp 98.1 F (36.7 C)     Temp Source Oral     SpO2 97 %     Weight 210 lb (95.3 kg)     Height 5\' 9"  (1.753 m)     Head Circumference      Peak Flow      Pain Score 9     Pain Loc      Pain Edu?      Excl. in [place]?    Constitutional: Alert and oriented. Well appearing and in no acute distress. Cardiovascular: Normal rate, regular rhythm. Grossly normal heart sounds.  Good peripheral circulation.  Elevated blood pressure Respiratory: Normal respiratory effort.  No retractions. Lungs CTAB. Gastrointestinal: Soft and nontender. No distention. No abdominal bruits. No CVA tenderness.  Nonthrombosed hemorrhoidal tissue Skin:  Skin is warm, dry and intact. No rash noted. Psychiatric: Mood and affect are normal. Speech and behavior are normal.  ____________________________________________   LABS (all labs ordered are listed, but only abnormal results are displayed)  Labs Reviewed - No data to display ____________________________________________  EKG   ____________________________________________  RADIOLOGY , personally viewed and  evaluated these images (plain radiographs) as part of my medical decision making, as well as reviewing the written report by the radiologist.  ED MD interpretation:    Official radiology report(s): No results found.  ____________________________________________   PROCEDURES  Procedure(s) performed (including Critical Care):  Procedures   ____________________________________________   INITIAL IMPRESSION / ASSESSMENT AND PLAN / ED COURSE  As part of my medical  decision making, I reviewed the following data within the electronic MEDICAL RECORD NUMBER         Patient presents with nonthrombosed rectal hemorrhoid.  Annual reduction performed.  Patient given discharge care instructions and a prescription for Anusol and Colace.  Patient advised follow gastroenterologist if condition persists.     ____________________________________________   FINAL CLINICAL IMPRESSION(S) / ED DIAGNOSES  Final diagnoses:  External hemorrhoid     ED Discharge Orders          Ordered    hydrocortisone (ANUSOL-HC) 25 MG suppository  Every 12 hours        12/07/20 1249    docusate sodium (COLACE) 100 MG capsule  2 times daily        12/07/20 1249             Note:  This document was prepared using Dragon voice recognition software and may include unintentional dictation errors.    Jahsiah, Carpenter, PA-C 12/07/20 1253    Merwyn Katos, MD 12/07/20 1540

## 2020-12-07 NOTE — ED Triage Notes (Signed)
Pt to ED for hemorrhoids, no other complaints. Ambulatory, NAD noted

## 2020-12-07 NOTE — Discharge Instructions (Addendum)
Read and follow discharge care instruction.  Take medication as directed. 

## 2021-02-01 ENCOUNTER — Other Ambulatory Visit: Payer: Self-pay

## 2021-02-01 ENCOUNTER — Emergency Department: Payer: Self-pay

## 2021-02-01 ENCOUNTER — Encounter: Payer: Self-pay | Admitting: Emergency Medicine

## 2021-02-01 ENCOUNTER — Emergency Department
Admission: EM | Admit: 2021-02-01 | Discharge: 2021-02-01 | Disposition: A | Discharge status: Home / Self Care | Payer: Self-pay | Attending: Emergency Medicine | Admitting: Emergency Medicine

## 2021-02-01 DIAGNOSIS — I10 Essential (primary) hypertension: Secondary | ICD-10-CM | POA: Insufficient documentation

## 2021-02-01 DIAGNOSIS — Z87891 Personal history of nicotine dependence: Secondary | ICD-10-CM | POA: Insufficient documentation

## 2021-02-01 DIAGNOSIS — M25512 Pain in left shoulder: Secondary | ICD-10-CM | POA: Insufficient documentation

## 2021-02-01 DIAGNOSIS — M5412 Radiculopathy, cervical region: Secondary | ICD-10-CM | POA: Insufficient documentation

## 2021-02-01 MED ORDER — MELOXICAM 15 MG PO TABS: 15.0000 mg | ORAL_TABLET | Freq: Every day | ORAL | 0 refills | Status: DC

## 2021-02-01 MED ORDER — METHOCARBAMOL 500 MG PO TABS: 500.0000 mg | ORAL_TABLET | Freq: Four times a day (QID) | ORAL | 0 refills | Status: DC

## 2021-02-01 MED ORDER — ORPHENADRINE CITRATE 30 MG/ML IJ SOLN
60.0000 mg | Freq: Once | INTRAMUSCULAR | Status: AC
  Administered 2021-02-01: 60 mg via INTRAMUSCULAR
  Filled 2021-02-01: qty 2

## 2021-02-01 MED ORDER — KETOROLAC TROMETHAMINE 30 MG/ML IJ SOLN
30.0000 mg | Freq: Once | INTRAMUSCULAR | Status: AC
  Administered 2021-02-01: 30 mg via INTRAMUSCULAR
  Filled 2021-02-01: qty 1

## 2021-02-01 NOTE — ED Triage Notes (Signed)
C/O left upper back and neck pain x 3 weeks.  Worse with activity.

## 2021-02-01 NOTE — ED Provider Notes (Signed)
[facility] Emergency Department Provider Note  ____________________________________________  Time seen: Approximately 3:19 PM  I have reviewed the triage vital signs and the nursing notes.   HISTORY  Chief Complaint Neck Pain    HPI Jason Nash is a 60 y.o. male who presents the emergency department complaining of left-sided neck/posterior shoulder pain.  Patient states that he has a sharp sensation like somebody is poking him with a knife or a burning sensation running out of the left side of his neck into the posterior left shoulder.  Patient states that he does repetitive motions for work, lifts heavy items and that this has been a gradual onset of symptoms.  There is no acute injury.  No falls, motor vehicle collisions.  Patient states that this has happened in the past but its been "a long time" since the last episode.  Over-the-counter medications are unsuccessful in resolving the patient's symptoms.  There is no loss of range of motion of the left upper extremity, numbness, tingling of the left upper extremity.  No other complaints at this time.  Patient denies any headache, right-sided neck pain, chest pain, shortness of breath.       Past Medical History:  Diagnosis Date   Hypertension     There are no problems to display for this patient.   History reviewed. No pertinent surgical history.  Prior to Admission medications   Medication Sig Start Date End Date Taking? Authorizing Provider  meloxicam (MOBIC) 15 MG tablet Take 1 tablet (15 mg total) by mouth daily. 02/01/21   Latangela Mccomas, Delorise Royals, PA-C  methocarbamol (ROBAXIN) 500 MG tablet Take 1 tablet (500 mg total) by mouth 4 (four) times daily. 02/01/21   Anastasija Anfinson, Delorise Royals, PA-C    Allergies Patient has no known allergies.  No family history on file.  Social History Social History   Tobacco Use   Smoking status: Former   Smokeless tobacco: Never  Substance Use Topics   Alcohol  use: No   Drug use: No     Review of Systems  Constitutional: No fever/chills Eyes: No visual changes. No discharge ENT: No upper respiratory complaints. Cardiovascular: no chest pain. Respiratory: no cough. No SOB. Gastrointestinal: No abdominal pain.  No nausea, no vomiting.  No diarrhea.  No constipation. Musculoskeletal: Positive for left-sided neck pain Skin: Negative for rash, abrasions, lacerations, ecchymosis. Neurological: Negative for headaches, focal weakness or numbness.  10 System ROS otherwise negative.  ____________________________________________   PHYSICAL EXAM:  VITAL SIGNS: ED Triage Vitals  Enc Vitals Group     BP 02/01/21 1355 139/88     Pulse Rate 02/01/21 1355 (!) 57     Resp 02/01/21 1355 14     Temp 02/01/21 1355 98.2 F (36.8 C)     Temp Source 02/01/21 1355 Oral     SpO2 02/01/21 1355 96 %     Weight 02/01/21 1351 210 lb 1.6 oz (95.3 kg)     Height 02/01/21 1351 5\' 9"  (1.753 m)     Head Circumference --      Peak Flow --      Pain Score 02/01/21 1351 8     Pain Loc --      Pain Edu? --      Excl. in [place]? --      Constitutional: Alert and oriented. Well appearing and in no acute distress. Eyes: Conjunctivae are normal. PERRL. EOMI. Head: Atraumatic. ENT:      Ears:  Nose: No congestion/rhinnorhea.      Mouth/Throat: Mucous membranes are moist.  Neck: No stridor.  No midline or right-sided cervical spine tenderness to palpation.  Patient is tender to palpation along the left spinal border over the left paraspinal muscle groups extending towards the trapezius muscle.  Does not extend all the way into the trapezius muscle groups.  No tenderness along the left-sided scapular spine.  Full range of motion to the shoulder.  Radial pulses sensation left upper extremity.  Cardiovascular: Normal rate, regular rhythm. Normal S1 and S2.  Good peripheral circulation. Respiratory: Normal respiratory effort without tachypnea or retractions. Lungs  CTAB. Good air entry to the bases with no decreased or absent breath sounds. Musculoskeletal: Full range of motion to all extremities. No gross deformities appreciated. Neurologic:  Normal speech and language. No gross focal neurologic deficits are appreciated.  Skin:  Skin is warm, dry and intact. No rash noted. Psychiatric: Mood and affect are normal. Speech and behavior are normal. Patient exhibits appropriate insight and judgement.   ____________________________________________   LABS (all labs ordered are listed, but only abnormal results are displayed)  Labs Reviewed - No data to display ____________________________________________  EKG   ____________________________________________  RADIOLOGY I personally viewed and evaluated these images as part of my medical decision making, as well as reviewing the written report by the radiologist.  ED Provider Interpretation: Degenerative changes without acute findings on x-ray of the cervical spine  DG Cervical Spine 2-3 Views  Result Date: 02/01/2021 CLINICAL DATA:  Left neck pain radiating into shoulder. No known injury. EXAM: CERVICAL SPINE - 2-3 VIEW COMPARISON:  Jason Nash. FINDINGS: Minimal anterolisthesis of C4 on C5. Straightening of normal cervical lordosis likely related to patient positioning or muscle spasm. Vertebral body heights are maintained. Mild disc height loss at C6-C7. Mild anterior endplate spurring at C4-C5, C5-C6, C6-C7. Facet degenerative changes seen throughout the cervical spine. IMPRESSION: 1. No acute abnormality of the cervical spine. 2. Multilevel degenerative changes as above. Electronically Signed   By: Acquanetta Belling M.D.   On: 02/01/2021 16:16    ____________________________________________    PROCEDURES  Procedure(s) performed:    Procedures    Medications  ketorolac (TORADOL) 30 MG/ML injection 30 mg (30 mg Intramuscular Given 02/01/21 1657)  orphenadrine (NORFLEX) injection 60 mg (60 mg  Intramuscular Given 02/01/21 1657)     ____________________________________________   INITIAL IMPRESSION / ASSESSMENT AND PLAN / ED COURSE  Pertinent labs & imaging results that were available during my care of the patient were reviewed by me and considered in my medical decision making (see chart for details).  Review of the [place] CSRS was performed in accordance of the [facility] prior to dispensing any controlled drugs.           Patient's diagnosis is consistent with cervical radiculopathy.  Patient presented to the emergency department with a complaint of pain radiating on the left side of the neck into the left shoulder.  No loss of range of motion to the neck or shoulder.  Neurologically patient was intact.  Imaging revealed multiple degenerative changes.  Without a specific injury precipitating this I do not feel that further imaging is warranted at this time.  Patient will be treated symptomatically with Toradol and Norflex here, meloxicam and Robaxin at home.  Return precautions discussed with the patient.  Follow-up primary care as needed.. Patient is given ED precautions to return to the ED for any worsening or new symptoms.     ____________________________________________  FINAL CLINICAL IMPRESSION(S) / ED DIAGNOSES  Final diagnoses:  Cervical radiculopathy      NEW MEDICATIONS STARTED DURING THIS VISIT:  ED Discharge Orders          Ordered    meloxicam (MOBIC) 15 MG tablet  Daily,   Status:  Discontinued        02/01/21 1643    methocarbamol (ROBAXIN) 500 MG tablet  4 times daily,   Status:  Discontinued        02/01/21 1643    meloxicam (MOBIC) 15 MG tablet  Daily        02/01/21 1645    methocarbamol (ROBAXIN) 500 MG tablet  4 times daily        02/01/21 1645                This chart was dictated using voice recognition software/Dragon. Despite best efforts to proofread, errors can occur which can change the meaning. Any change was purely  unintentional.    Racheal Patches, PA-C 02/01/21 1702    Concha Se, MD 02/02/21 1120

## 2021-02-01 NOTE — ED Notes (Signed)
See triage note  Presents with pain to neck and left shoulder  States pain started about 3 weeks ago   No deformity noted

## 2021-02-26 ENCOUNTER — Encounter: Payer: Self-pay | Admitting: Emergency Medicine

## 2021-02-26 ENCOUNTER — Emergency Department
Admission: EM | Admit: 2021-02-26 | Discharge: 2021-02-26 | Disposition: A | Discharge status: Home / Self Care | Payer: Self-pay | Attending: Emergency Medicine | Admitting: Emergency Medicine

## 2021-02-26 ENCOUNTER — Other Ambulatory Visit: Payer: Self-pay

## 2021-02-26 DIAGNOSIS — S46812D Strain of other muscles, fascia and tendons at shoulder and upper arm level, left arm, subsequent encounter: Secondary | ICD-10-CM

## 2021-02-26 DIAGNOSIS — Z87891 Personal history of nicotine dependence: Secondary | ICD-10-CM | POA: Insufficient documentation

## 2021-02-26 DIAGNOSIS — M5412 Radiculopathy, cervical region: Secondary | ICD-10-CM | POA: Insufficient documentation

## 2021-02-26 DIAGNOSIS — I10 Essential (primary) hypertension: Secondary | ICD-10-CM | POA: Insufficient documentation

## 2021-02-26 DIAGNOSIS — X58XXXA Exposure to other specified factors, initial encounter: Secondary | ICD-10-CM | POA: Insufficient documentation

## 2021-02-26 DIAGNOSIS — S46812A Strain of other muscles, fascia and tendons at shoulder and upper arm level, left arm, initial encounter: Secondary | ICD-10-CM | POA: Insufficient documentation

## 2021-02-26 MED ORDER — CYCLOBENZAPRINE HCL 5 MG PO TABS: 5.0000 mg | ORAL_TABLET | Freq: Three times a day (TID) | ORAL | 0 refills | Status: DC | PRN

## 2021-02-26 MED ORDER — PREDNISONE 20 MG PO TABS: 40.0000 mg | ORAL_TABLET | Freq: Every day | ORAL | 0 refills | Status: AC

## 2021-02-26 MED ORDER — DICLOFENAC SODIUM 50 MG PO TBEC: 50.0000 mg | DELAYED_RELEASE_TABLET | Freq: Two times a day (BID) | ORAL | 1 refills | Status: DC

## 2021-02-26 MED ORDER — LIDOCAINE 5 % EX PTCH: 1.0000 | MEDICATED_PATCH | Freq: Two times a day (BID) | CUTANEOUS | 0 refills | Status: AC | PRN

## 2021-02-26 MED ORDER — HYDROCODONE-ACETAMINOPHEN 5-325 MG PO TABS: 1.0000 | ORAL_TABLET | Freq: Three times a day (TID) | ORAL | 0 refills | Status: DC | PRN

## 2021-02-26 NOTE — ED Provider Notes (Signed)
[facility] Emergency Department Provider Note ____________________________________________  Time seen: 1450  I have reviewed the triage vital signs and the nursing notes.  HISTORY  Chief Complaint  Shoulder Pain and Neck Pain   HPI BRONTE Nash is a 60 y.o. male with history of hypertension, presents to the ED with complaints of ongoing left upper shoulder muscle pain.  Patient reports symptoms for greater than the last 6 months and has had at least 2 ED visits related to his complaints but he denies any preceding injury, trauma, or fall.  He reports pain to the upper trapezius musculature that is aggravated by work activity and attempting to lie on the arm at night.  He denies any distal paresthesias, chest pain, or shortness of breath.  He also denies any significant benefit from previously prescribed muscle relaxants and anti-inflammatories.  Past Medical History:  Diagnosis Date   Hypertension     There are no problems to display for this patient.   History reviewed. No pertinent surgical history.  Prior to Admission medications   Medication Sig Start Date End Date Taking? Authorizing Provider  cyclobenzaprine (FLEXERIL) 5 MG tablet Take 1 tablet (5 mg total) by mouth 3 (three) times daily as needed. 02/26/21  Yes [name], Charlesetta Ivory, PA-C  diclofenac (VOLTAREN) 50 MG EC tablet Take 1 tablet (50 mg total) by mouth 2 (two) times daily. 02/26/21  Yes [name], Charlesetta Ivory, PA-C  HYDROcodone-acetaminophen (NORCO) 5-325 MG tablet Take 1 tablet by mouth 3 (three) times daily as needed. 02/26/21  Yes [name], Charlesetta Ivory, PA-C  lidocaine (LIDODERM) 5 % Place 1 patch onto the skin every 12 (twelve) hours as needed for up to 10 days. Remove & Discard patch after 12 hours of wear each day. 02/26/21 03/08/21 Yes [name], Charlesetta Ivory, PA-C  predniSONE (DELTASONE) 20 MG tablet Take 2 tablets (40 mg total) by mouth daily with breakfast for 5 days. 02/26/21  03/03/21 Yes [name], Charlesetta Ivory, PA-C    Allergies Patient has no known allergies.  History reviewed. No pertinent family history.  Social History Social History   Tobacco Use   Smoking status: Former   Smokeless tobacco: Never  Substance Use Topics   Alcohol use: No   Drug use: No    Review of Systems  Constitutional: Negative for fever. Cardiovascular: Negative for chest pain. Respiratory: Negative for shortness of breath. Gastrointestinal: Negative for abdominal pain, vomiting and diarrhea. Genitourinary: Negative for dysuria. Musculoskeletal: Negative for back pain. Left trapezius muscle pain.  Skin: Negative for rash. Neurological: Negative for headaches, focal weakness or numbness. ____________________________________________  PHYSICAL EXAM:  VITAL SIGNS: ED Triage Vitals  Enc Vitals Group     BP 02/26/21 1349 (!) 159/94     Pulse Rate 02/26/21 1349 75     Resp 02/26/21 1349 20     Temp 02/26/21 1349 98.2 F (36.8 C)     Temp Source 02/26/21 1349 Oral     SpO2 02/26/21 1349 95 %     Weight 02/26/21 1350 209 lb 7 oz (95 kg)     Height 02/26/21 1350 5\' 9"  (1.753 m)     Head Circumference --      Peak Flow --      Pain Score 02/26/21 1350 10     Pain Loc --      Pain Edu? --      Excl. in GC? --     Constitutional: Alert and oriented. Well appearing  and in no distress. Head: Normocephalic and atraumatic. Eyes: Conjunctivae are normal. Normal extraocular movements Neck: Supple. No thyromegaly. Normal ROM Cardiovascular: Normal rate, regular rhythm. Normal distal pulses. Respiratory: Normal respiratory effort. No wheezes/rales/rhonchi. Gastrointestinal: Soft and nontender. No distention. Musculoskeletal: Left shoulder without obvious deformity, dislocation, or sulcus sign.  Active range of motion is appreciated.  Patient is tender to palpation over the left upper trapezius musculature.  Tenderness extends into the lateral left neck.  No rotator cuff  deficit is appreciated.  Normal composite fist distally.  Nontender with normal range of motion in all extremities.  Neurologic: Cranial nerves II to XII grossly intact.  Normal UV DTRs bilaterally.  Normal gait without ataxia. Normal speech and language. No gross focal neurologic deficits are appreciated. Skin:  Skin is warm, dry and intact. No rash noted. Psychiatric: Mood and affect are normal. Patient exhibits appropriate insight and judgment. ____________________________________________    {LABS (pertinent positives/negatives)  ____________________________________________  {EKG  ____________________________________________   RADIOLOGY Official radiology report(s): No results found. ____________________________________________  PROCEDURES   Procedures ____________________________________________   INITIAL IMPRESSION / ASSESSMENT AND PLAN / ED COURSE  As part of my medical decision making, I reviewed the following data within the electronic MEDICAL RECORD NUMBER Radiograph reviewed as noted, Notes from prior ED visits, and [place] Controlled Substance Database    DDX: cervical radiculopathy, muscle strain, shoulder tendinitis  Patient ED evaluation of continued left muscular pain and tension.  He presents to the ED for evaluation of his symptoms.  His exam is consistent with upper extremity muscle tenderness.  Review of his previous x-rays reveal significant multilevel degenerative disc disease of the cervical spine.  He may be experiencing some mild radicular symptoms related to his underlying arthritis.  Patient will be discharged with instructions to follow-up with Ortho for ongoing symptoms.  No indication at this time for any further imaging or invasive studies.  Patient is agreeable to plan of changes medications as well as referral to Ortho for further evaluation of his symptoms.  He will follow-up as directed or return to the ED if needed.  Jason Nash was evaluated in  Emergency Department on 02/26/2021 for the symptoms described in the history of present illness. He was evaluated in the context of the global COVID-19 pandemic, which necessitated consideration that the patient might be at risk for infection with the SARS-CoV-2 virus that causes COVID-19. Institutional protocols and algorithms that pertain to the evaluation of patients at risk for COVID-19 are in a state of rapid change based on information released by regulatory bodies including the CDC and federal and state organizations. These policies and algorithms were followed during the patient's care in the ED.  I reviewed the patient's prescription history over the last 12 months in the multi-state controlled substances database(s) that includes [place], [place], [place], [place], [place], [place], [place], [place], [place], [place], [place], [place], [place], and [place].  Results were notable for no RX history. ____________________________________________  FINAL CLINICAL IMPRESSION(S) / ED DIAGNOSES  Final diagnoses:  Trapezius muscle strain, left, subsequent encounter  Cervical radiculopathy      Lissa Hoard, PA-C 02/26/21 1432    Dionne Bucy, MD 02/27/21 1550

## 2021-02-26 NOTE — ED Triage Notes (Signed)
Pt via POV from home. Pt has had shoulder pain for the past 6 months. Pt states he was seen 2 weeks ago and pt states the medication prescribed did not help. Pt is A&OX4 and NAD.

## 2021-02-26 NOTE — Discharge Instructions (Addendum)
Your exam and XR are consistent with a muscle strain and probable nerve irritation from underlying arthritis of the neck. Take the prescription meds as directed. Follow-up with Ortho at [facility] for continued symptoms. Take the steroid (prednisone) for the next 5 days with the other medicines, then start the Diclofenac as your twice-daily anti-inflammatory.

## 2021-03-07 ENCOUNTER — Emergency Department: Payer: Medicaid HMO

## 2021-03-07 ENCOUNTER — Other Ambulatory Visit (INDEPENDENT_AMBULATORY_CARE_PROVIDER_SITE_OTHER): Payer: Self-pay | Admitting: Residents

## 2021-03-07 ENCOUNTER — Observation Stay
Admission: EM | Admit: 2021-03-07 | Discharge: 2021-03-08 | Disposition: A | Discharge status: Home / Self Care | Payer: Medicaid HMO | Attending: Hospitalist | Admitting: Hospitalist

## 2021-03-07 ENCOUNTER — Observation Stay: Payer: Medicaid HMO

## 2021-03-07 DIAGNOSIS — R072 Precordial pain: Secondary | ICD-10-CM

## 2021-03-07 DIAGNOSIS — N400 Enlarged prostate without lower urinary tract symptoms: Secondary | ICD-10-CM | POA: Insufficient documentation

## 2021-03-07 DIAGNOSIS — Z7984 Long term (current) use of oral hypoglycemic drugs: Secondary | ICD-10-CM | POA: Insufficient documentation

## 2021-03-07 DIAGNOSIS — R112 Nausea with vomiting, unspecified: Secondary | ICD-10-CM | POA: Insufficient documentation

## 2021-03-07 DIAGNOSIS — E785 Hyperlipidemia, unspecified: Secondary | ICD-10-CM

## 2021-03-07 DIAGNOSIS — E119 Type 2 diabetes mellitus without complications: Secondary | ICD-10-CM

## 2021-03-07 DIAGNOSIS — J939 Pneumothorax, unspecified: Secondary | ICD-10-CM | POA: Insufficient documentation

## 2021-03-07 DIAGNOSIS — R079 Chest pain, unspecified: Secondary | ICD-10-CM | POA: Diagnosis present

## 2021-03-07 DIAGNOSIS — Z79899 Other long term (current) drug therapy: Secondary | ICD-10-CM | POA: Insufficient documentation

## 2021-03-07 DIAGNOSIS — I10 Essential (primary) hypertension: Secondary | ICD-10-CM | POA: Insufficient documentation

## 2021-03-07 DIAGNOSIS — Z87891 Personal history of nicotine dependence: Secondary | ICD-10-CM | POA: Insufficient documentation

## 2021-03-07 DIAGNOSIS — E782 Mixed hyperlipidemia: Secondary | ICD-10-CM

## 2021-03-07 DIAGNOSIS — R200 Anesthesia of skin: Secondary | ICD-10-CM | POA: Insufficient documentation

## 2021-03-07 DIAGNOSIS — Z7982 Long term (current) use of aspirin: Secondary | ICD-10-CM | POA: Insufficient documentation

## 2021-03-07 DIAGNOSIS — R0789 Other chest pain: Principal | ICD-10-CM | POA: Insufficient documentation

## 2021-03-07 HISTORY — DX: Essential (primary) hypertension: I10

## 2021-03-07 HISTORY — DX: Hyperlipidemia, unspecified: E78.5

## 2021-03-07 HISTORY — DX: Type 2 diabetes mellitus without complications: E11.9

## 2021-03-07 HISTORY — DX: Benign prostatic hyperplasia without lower urinary tract symptoms: N40.0

## 2021-03-07 LAB — CBC AND DIFFERENTIAL
Absolute NRBC: 0 10*3/uL (ref 0.00–0.00)
Basophils Absolute Automated: 0.04 10*3/uL (ref 0.00–0.08)
Basophils Automated: 1 %
Eosinophils Absolute Automated: 0.14 10*3/uL (ref 0.00–0.44)
Eosinophils Automated: 3.4 %
Hematocrit: 45.3 % (ref 37.6–49.6)
Hgb: 14.9 g/dL (ref 12.5–17.1)
Immature Granulocytes Absolute: 0.01 10*3/uL (ref 0.00–0.07)
Immature Granulocytes: 0.2 %
Lymphocytes Absolute Automated: 1.82 10*3/uL (ref 0.42–3.22)
Lymphocytes Automated: 44.6 %
MCH: 29.7 pg (ref 25.1–33.5)
MCHC: 32.9 g/dL (ref 31.5–35.8)
MCV: 90.4 fL (ref 78.0–96.0)
MPV: 12.1 fL (ref 8.9–12.5)
Monocytes Absolute Automated: 0.47 10*3/uL (ref 0.21–0.85)
Monocytes: 11.5 %
Neutrophils Absolute: 1.6 10*3/uL (ref 1.10–6.33)
Neutrophils: 39.3 %
Nucleated RBC: 0 /100 WBC (ref 0.0–0.0)
Platelets: 214 10*3/uL (ref 142–346)
RBC: 5.01 10*6/uL (ref 4.20–5.90)
RDW: 13 % (ref 11–15)
WBC: 4.08 10*3/uL (ref 3.10–9.50)

## 2021-03-07 LAB — COMPREHENSIVE METABOLIC PANEL
ALT: 11 U/L (ref 0–55)
AST (SGOT): 14 U/L (ref 5–41)
Albumin/Globulin Ratio: 1.3 (ref 0.9–2.2)
Albumin: 4 g/dL (ref 3.5–5.0)
Alkaline Phosphatase: 38 U/L (ref 37–117)
Anion Gap: 10 (ref 5.0–15.0)
BUN: 13 mg/dL (ref 9.0–28.0)
Bilirubin, Total: 0.6 mg/dL (ref 0.2–1.2)
CO2: 26 mEq/L (ref 17–29)
Calcium: 9.6 mg/dL (ref 8.5–10.5)
Chloride: 105 mEq/L (ref 99–111)
Creatinine: 1 mg/dL (ref 0.5–1.5)
Globulin: 3.1 g/dL (ref 2.0–3.6)
Glucose: 113 mg/dL — ABNORMAL HIGH (ref 70–100)
Potassium: 4.7 mEq/L (ref 3.5–5.3)
Protein, Total: 7.1 g/dL (ref 6.0–8.3)
Sodium: 141 mEq/L (ref 135–145)

## 2021-03-07 LAB — TROPONIN I
Troponin I: <0.01 ng/mL (ref 0.00–0.05)
Troponin I: <0.01 ng/mL (ref 0.00–0.05)
Troponin I: <0.01 ng/mL (ref 0.00–0.05)
Troponin I: <0.01 ng/mL (ref 0.00–0.05)
Troponin I: <0.01 ng/mL (ref 0.00–0.05)

## 2021-03-07 LAB — COVID-19 (SARS-COV-2): SARS CoV 2 Overall Result: NOT DETECTED

## 2021-03-07 LAB — GLUCOSE WHOLE BLOOD - POCT
Whole Blood Glucose POCT: 173 mg/dL — ABNORMAL HIGH (ref 70–100)
Whole Blood Glucose POCT: 103 mg/dL — ABNORMAL HIGH (ref 70–100)

## 2021-03-07 LAB — GFR: EGFR: >60

## 2021-03-07 MED ORDER — ASPIRIN 81 MG PO CHEW
324.0000 mg | CHEWABLE_TABLET | Freq: Once | ORAL | Status: AC
Start: 2021-03-07 — End: 2021-03-07
  Administered 2021-03-07: 09:00:00 324 mg via ORAL
  Filled 2021-03-07: qty 4

## 2021-03-07 MED ORDER — NITROGLYCERIN 0.4 MG SL SUBL
0.4000 mg | SUBLINGUAL_TABLET | SUBLINGUAL | Status: DC | PRN
Start: 2021-03-07 — End: 2021-03-08

## 2021-03-07 MED ORDER — METOPROLOL TARTRATE 5 MG/5ML IV SOLN
INTRAVENOUS | Status: AC
Start: 2021-03-07 — End: 2021-03-07
  Administered 2021-03-07: 14:00:00 5 mg
  Filled 2021-03-07: qty 5

## 2021-03-07 MED ORDER — DEXTROSE 10 % IV BOLUS
25.0000 g | INTRAVENOUS | Status: DC | PRN
Start: 2021-03-07 — End: 2021-03-08

## 2021-03-07 MED ORDER — IOHEXOL 350 MG/ML IV SOLN
70.0000 mL | Freq: Once | INTRAVENOUS | Status: AC | PRN
Start: 2021-03-07 — End: 2021-03-07
  Administered 2021-03-07: 14:00:00 70 mL via INTRAVENOUS

## 2021-03-07 MED ORDER — LIDOCAINE VISCOUS HCL 2 % MT SOLN
10.0000 mL | Freq: Two times a day (BID) | OROMUCOSAL | Status: DC | PRN
Start: 2021-03-07 — End: 2021-03-08

## 2021-03-07 MED ORDER — GLUCAGON 1 MG IJ SOLR (WRAP)
1.0000 mg | INTRAMUSCULAR | Status: DC | PRN
Start: 2021-03-07 — End: 2021-03-08

## 2021-03-07 MED ORDER — INSULIN LISPRO 100 UNIT/ML SOLN (WRAP)
1.0000 [IU] | Freq: Every evening | Status: DC
Start: 2021-03-07 — End: 2021-03-08

## 2021-03-07 MED ORDER — METOPROLOL SUCCINATE ER 25 MG PO TB24
25.0000 mg | ORAL_TABLET | Freq: Every day | ORAL | Status: DC
Start: 2021-03-07 — End: 2021-03-08
  Administered 2021-03-07 – 2021-03-08 (×2): 25 mg via ORAL
  Filled 2021-03-07 (×2): qty 1

## 2021-03-07 MED ORDER — DEXTROSE 5% IV BOLUS
250.0000 mL | INTRAVENOUS | Status: DC | PRN
Start: 2021-03-07 — End: 2021-03-08

## 2021-03-07 MED ORDER — INSULIN LISPRO 100 UNIT/ML SOLN (WRAP)
1.0000 [IU] | Freq: Three times a day (TID) | Status: DC
Start: 2021-03-07 — End: 2021-03-08

## 2021-03-07 MED ORDER — NITROGLYCERIN 0.4 MG SL SUBL
0.4000 mg | SUBLINGUAL_TABLET | SUBLINGUAL | Status: DC | PRN
Start: 2021-03-07 — End: 2021-03-07

## 2021-03-07 MED ORDER — ALUM & MAG HYDROXIDE-SIMETH 200-200-20 MG/5ML PO SUSP
30.0000 mL | Freq: Two times a day (BID) | ORAL | Status: DC | PRN
Start: 2021-03-07 — End: 2021-03-08

## 2021-03-07 MED ORDER — FAMOTIDINE 20 MG PO TABS
20.0000 mg | ORAL_TABLET | Freq: Two times a day (BID) | ORAL | Status: DC | PRN
Start: 2021-03-07 — End: 2021-03-08

## 2021-03-07 MED ORDER — LABETALOL HCL 5 MG/ML IV SOLN (WRAP)
10.0000 mg | Freq: Once | INTRAVENOUS | Status: DC
Start: 2021-03-07 — End: 2021-03-07

## 2021-03-07 MED ORDER — NITROGLYCERIN 0.4 MG SL SUBL
SUBLINGUAL_TABLET | SUBLINGUAL | Status: AC
Start: 2021-03-07 — End: 2021-03-07
  Administered 2021-03-07: 14:00:00 0.4 mg via SUBLINGUAL
  Filled 2021-03-07: qty 25

## 2021-03-07 MED ORDER — ONDANSETRON HCL 4 MG/2ML IJ SOLN
4.0000 mg | Freq: Three times a day (TID) | INTRAMUSCULAR | Status: DC | PRN
Start: 2021-03-07 — End: 2021-03-08

## 2021-03-07 MED ORDER — DEXTROSE 50 % IV SOLN
25.0000 g | INTRAVENOUS | Status: DC | PRN
Start: 2021-03-07 — End: 2021-03-08

## 2021-03-07 MED ORDER — AMINOPHYLLINE BOLUS (FROM VIAL)
50.0000 mg | Freq: Once | INTRAVENOUS | Status: DC | PRN
Start: 2021-03-07 — End: 2021-03-07

## 2021-03-07 MED ORDER — ENOXAPARIN SODIUM 40 MG/0.4ML IJ SOSY
40.0000 mg | PREFILLED_SYRINGE | Freq: Every day | INTRAMUSCULAR | Status: DC
Start: 2021-03-07 — End: 2021-03-08
  Administered 2021-03-07 – 2021-03-08 (×2): 40 mg via SUBCUTANEOUS
  Filled 2021-03-07 (×2): qty 0.4

## 2021-03-07 MED ORDER — ASPIRIN 81 MG PO CHEW
81.0000 mg | CHEWABLE_TABLET | Freq: Once | ORAL | Status: DC | PRN
Start: 2021-03-07 — End: 2021-03-07

## 2021-03-07 MED ORDER — ATORVASTATIN CALCIUM 40 MG PO TABS
40.0000 mg | ORAL_TABLET | Freq: Every evening | ORAL | Status: DC
Start: 2021-03-07 — End: 2021-03-08
  Administered 2021-03-07 (×2): 40 mg via ORAL
  Filled 2021-03-07: qty 2
  Filled 2021-03-07: qty 1

## 2021-03-07 NOTE — Plan of Care (Signed)
NURSING SHIFT NOTE     Patient: Craig Ford  Day: 0      SHIFT EVENTS     Shift Narrative/Significant Events (PRN med administration, fall, RRT, etc.):     Pt A/O x 4, O2 sat at 98% on RA, independent with ADLs, VSS, NSR on the monitor. Complained of HA 6/10, relieved by rest. Denies chest pain or SOB. No BM on this shift. Safety and fall precautions remain in place. Purposeful rounding completed. Will continue to monitor.         ASSESSMENT     Changes in assessment from patient's baseline this shift:    Neuro: No  CV: No  Pulm: No  Peripheral Vascular: No  HEENT: No  GI: No  BM during shift: No, Last BM:    GU: No   Integ: No  MS: No    Pain: None  Pain Interventions: NA  Medications Utilized: NA    Mobility: PMP Activity: Step 6 - Walks in Room of Distance Walked (ft) (Step 6,7): 5 Feet           Lines     Patient Lines/Drains/Airways Status       Active Lines, Drains and Airways       Name Placement date Placement time Site Days    Peripheral IV 03/07/21 20 G Right Antecubital 03/07/21  0652  Antecubital  less than 1    Peripheral IV 03/07/21 20 G Left Antecubital 03/07/21  1908  Antecubital  less than 1    Peripheral IV 03/07/21 22 G Distal;Posterior;Right Upper Arm 03/07/21  1600  Upper Arm  less than 1                         VITAL SIGNS     Vitals:    03/07/21 1858   BP: 128/80   Pulse: 76   Resp: 17   Temp: 97.7 F (36.5 C)   SpO2: 98%       Temp  Min: 97.7 F (36.5 C)  Max: 98.6 F (37 C)  Pulse  Min: 60  Max: 76  Resp  Min: 11  Max: 20  BP  Min: 128/80  Max: 170/104  SpO2  Min: 96 %  Max: 99 %      Intake/Output Summary (Last 24 hours) at 03/07/2021 2231  Last data filed at 03/07/2021 1858  Gross per 24 hour   Intake 120 ml   Output --   Net 120 ml            CARE PLAN            Problem: Safety  Goal: Patient will be free from injury during hospitalization  Outcome: Progressing  Flowsheets (Taken 03/07/2021 2228)  Patient will be free from injury during hospitalization:   Assess patient's risk  for falls and implement fall prevention plan of care per policy   Provide and maintain safe environment   Use appropriate transfer methods   Ensure appropriate safety devices are available at the bedside   Include patient/ family/ care giver in decisions related to safety   Hourly rounding     Problem: Chest Pain  Goal: Vital signs and cardiac rhythm stable  Outcome: Progressing  Flowsheets (Taken 03/07/2021 2228)  Vital signs and cardiac rhythm stable:   Monitor /assess vital signs/cardiac rhythms   Monitor labs   Assess the need for oxygen therapy and administer as ordered  Goal: Cardiac pain  management  Outcome: Progressing  Flowsheets (Taken 03/07/2021 2228)  Cardiac pain management:   Assess/report chest pain/or related discomfort to LIP immediately   Instruct patient to report any change in pain status   Assess pain/or related discomfort on admission, during daily assessment, before and after any intervention   Include patient and patient care companion in decisions related to pain management  Goal: Anxiety management/effective coping  Outcome: Progressing  Flowsheets (Taken 03/07/2021 2228)  Anxiety management/effective coping:   Assess/report uncontrolled anxiety, depression or ineffective coping to LIP   Offer reassurance to decrease anxiety   Encourage patient to immediately report any increase in anxiety and/or depression   Include patient in decision making of their care and give updates on their health status

## 2021-03-07 NOTE — Consults (Signed)
[facility] CARDI[facility] CONSULTATION REPORT  [facility]        Date Time: 03/07/21 8:56 AM  Patient Name: Craig Ford  Requesting Physician: Viviann Spare, MD  Consulting Cardiologist:  Andree Coss, DO  MRN:  [id number]  CSN:   [id number]  Date of Admission:  03/07/2021       Reason for Consultation:       Chest pain    History:   Craig Ford is a 60 y.o. male admitted on 03/07/2021.  We have been asked by Kari Baars A, MD,  to provide cardiac consultation, regarding chest pain.    60 year old male with past medical history of hypertension, hyperlipidemia, type 2 diabetes, and former smoker coming in with chest pain.  Patient states chest pain started yesterday morning.  Pain is a burning sensation in the left side of his chest.  It is associated with left hand and left foot numbness.  Patient states pain lasted for about 10 minutes before resolving.  It comes and goes throughout the day.  He has no associated symptoms with exertion.  Patient stopped smoking 20 years ago.  He saw cardiology about a year ago, does not remember where.  Patient has had these chest pains before.  Given the fact that they were recurrent he came to the emergency room.  This morning he feels relatively okay, no chest pain or left foot numbness.  He still has some numbness in his left thumb.  CT head done shows no acute bleeding.  X-ray is clear.  EKG is normal and troponin is negative.  Patient denies fever, night sweats and chills.    Past Medical History:     Past Medical History:   Diagnosis Date    Diabetes mellitus     Enlarged prostate     Hyperlipidemia     Hypertension        Past Surgical History:     Past Surgical History:   Procedure Laterality Date    SKIN GRAFT FULL THICKNESS LEG         Family History:   History reviewed. No pertinent family history.    Social History:     Social History     Socioeconomic History    Marital status: Single     Spouse name: Not on file     Number of children: Not on file    Years of education: Not on file    Highest education level: Not on file   Occupational History    Not on file   Tobacco Use    Smoking status: Former     Types: Cigarettes    Smokeless tobacco: Never   Vaping Use    Vaping Use: Never used   Substance and Sexual Activity    Alcohol use: Not Currently     Comment: 32 years ago    Drug use: Not Currently     Comment: marijuana    Sexual activity: Not on file   Other Topics Concern    Not on file   Social History Narrative    Not on file     Social Determinants of Health     Financial Resource Strain: Not on file   Food Insecurity: Not on file   Transportation Needs: Not on file   Physical Activity: Not on file   Stress: Not on file   Social Connections: Not on file   Intimate Partner Violence: Not on file   Housing Stability:  Not on file       Allergies:   No Known Allergies    Medications:   (Not in a hospital admission)       Current Facility-Administered Medications   Medication Dose Route Frequency    aspirin  324 mg Oral Once    labetalol  10 mg Intravenous Once         Review of Systems:    Comprehensive review of systems including constitutional, eyes, ears, nose, mouth, throat, cardiovascular, GI, GU, musculoskeletal, integumentary, respiratory, neurologic, psychiatric, and endocrine is negative other than what is mentioned already in the history of present illness    Physical Exam:     VITAL SIGNS PHYSICAL EXAM   Vitals:    03/07/21 0800   BP: 146/82   Pulse: 60   Resp: 14   Temp:    SpO2: 97%     Temp (24hrs), Avg:98.6 F (37 C), Min:98.6 F (37 C), Max:98.6 F (37 C)      Intake and Output Summary (Last 24 hours) at Date Time  No intake or output data in the 24 hours ending 03/07/21 0856    Telemetry: no changes Physical Exam  General: awake, alert, breathing comfortably, no acute distress  Head: normocephalic  Eyes: Lids & conjunctiva normal  Cardiovascular: regular rate and rhythm, normal S1, S2, no S3, no S4, no  murmurs, rubs or gallops  Neck: No JVD;  No Carotid bruit  Lungs: clear to auscultation bilateraly, without wheezing, rhonchi, or rales  Abdomen: soft, non-tender, no HJR  Extremities: no edema  Pulse: 2+ equal bilaterally   Neurological: No gross motor defect  Psychiatric: Alert and oriented X3, mood and affect normal  Musculoskeletal: normal strength and tone     Labs Reviewed:     Recent Labs   Lab 03/07/21  0648   Troponin I <0.01                         Recent Labs   Lab 03/07/21  0648   WBC 4.08   Hgb 14.9   Hematocrit 45.3   Platelets 214           DIAGNOSTIC    EKG: Normal sinus rhythm    chest X-ray    Assessment:   Chest pain  Hypertension  Hyperlipidemia  Type 2 diabetes  Former smoker    Recommendations:       60 year old male with past medical history of hypertension, hyperlipidemia, type 2 diabetes, and former smoker coming in with chest pain.  Patient's chest pain is atypical with a normal EKG and negative troponin x1.  He does have multiple risk factors for coronary disease.  We will proceed with coronary CT angiogram for further risk factor stratification.  I will add 40 mg of atorvastatin, aspirin and 25 mg of metoprolol to his medications.  He should also get an echocardiogram, although this can be done as an outpatient if his CTA is normal.  Case discussed with ER attending.  Patient potentially discharge later today if nonobstructive coronary disease and no recurrence of chest pain.        Signed by: Andree Coss, DO    [facility]  MD Spectralink [phone] or [phone] (8am-5pm)  Office/Pager: [phone]  NP Spectralink [phone]   After hours, non urgent consult line [phone]  After Hours, urgent consults [phone]

## 2021-03-07 NOTE — ED Notes (Signed)
[address] [facility] EMERGENCY DEPARTMENT  ED NURSING NOTE FOR THE RECEIVING INPATIENT NURSE   ED NURSE Silvestre Mesi [phone]   ED CHARGE RN [phone]   ADMISSION INFORMATION   Craig Ford is a 60 y.o. male admitted with a diagnosis of:    1. Chest pain         Isolation: None   Allergies: Patient has no known allergies.   Holding Orders confirmed? Yes   Belongings Documented? Yes   Home medications sent to pharmacy confirmed? No   NURSING CARE   Patient Comes From:   Mental Status: Home Independent  alert and oriented   ADL: Independent with all ADLs   Ambulation: no difficulty   Pertinent Information  and Safety Concerns: trop x2 sent      COVID Test sent to lab? Yes   VITAL SIGNS   Time BP Temp Pulse Resp SpO2   1541 145/72 98.4 60 15 98   CT / NIH   CT Head ordered on this patient?  Yes   NIH/Dysphagia assessment done prior to admission? Yes   PERSONAL PROTECTIVE EQUIPMENT   Gloves   LAB RESULTS   Labs Reviewed   COMPREHENSIVE METABOLIC PANEL - Abnormal; Notable for the following components:       Result Value    Glucose 113 (*)     All other components within normal limits   COVID-19 (SARS-COV-2)   CBC AND DIFFERENTIAL   TROPONIN I   GFR   TROPONIN I   TROPONIN I   TROPONIN I          Ticket to Ride Printed: Yes

## 2021-03-07 NOTE — Plan of Care (Addendum)
NURSING SHIFT NOTE     Patient: Craig Ford  Day: 0      SHIFT EVENTS     Shift Narrative/Significant Events (PRN med administration, fall, RRT, etc.):     Pt safely transferred to Unit 21 from home accompanied by the nurse. Report received from RN. VSS. Pt id band verified and oriented to unit. Placed telemetry and verified. Pt reports a pain score of 7/10 prior admission characterized by squeezing that lasted for 10 minutes. Denies chest pain and discomfort on the unit. Educated on IS use. Pt educated on importance of call bell use for pain and toileting needs. Pt belongings in room. All (high/moderate) fall risk precautions in place.  Safety and fall precautions remain in place. Purposeful rounding completed.          ASSESSMENT     Changes in assessment from patient's baseline this shift:    Neuro: No  CV: No  Pulm: No  Peripheral Vascular: No  HEENT: No  GI: No  BM during shift: No, Last BM:  03/07/2021  GU: No   Integ: No  MS: No    Pain: None  Pain Interventions: Rest           Lines     Patient Lines/Drains/Airways Status       Active Lines, Drains and Airways       Name Placement date Placement time Site Days    Peripheral IV 03/07/21 20 G Right Antecubital 03/07/21  0652  Antecubital  less than 1    Peripheral IV 03/07/21 18 G Standard Right Antecubital 03/07/21  1110  Antecubital  less than 1                         VITAL SIGNS     Vitals:    03/07/21 1858   BP: 128/80   Pulse: 76   Resp: 17   Temp: 97.7 F (36.5 C)   SpO2: 98%       Temp  Min: 97.7 F (36.5 C)  Max: 98.6 F (37 C)  Pulse  Min: 60  Max: 76  Resp  Min: 11  Max: 20  BP  Min: 128/80  Max: 170/104  SpO2  Min: 96 %  Max: 99 %    No intake or output data in the 24 hours ending 03/07/21 1902       CARE PLAN        Problem: Safety  Goal: Patient will be free from injury during hospitalization  03/07/2021 1859 by Marca Ancona, RN  Outcome: Progressing  03/07/2021 1859 by Marca Ancona, RN  Flowsheets (Taken 03/07/2021 1859)  Patient  will be free from injury during hospitalization:   Assess patient's risk for falls and implement fall prevention plan of care per policy   Provide and maintain safe environment   Use appropriate transfer methods   Ensure appropriate safety devices are available at the bedside  Goal: Patient will be free from infection during hospitalization  03/07/2021 1859 by Marca Ancona, RN  Outcome: Progressing  03/07/2021 1859 by Marca Ancona, RN  Flowsheets (Taken 03/07/2021 1859)  Free from Infection during hospitalization:   Monitor lab/diagnostic results   Assess and monitor for signs and symptoms of infection   Monitor all insertion sites (i.e. indwelling lines, tubes, urinary catheters, and drains)   Encourage patient and family to use good hand hygiene technique     Problem: Pain  Goal: Pain at adequate  level as identified by patient  03/07/2021 1859 by Marca Ancona, RN  Outcome: Progressing  03/07/2021 1859 by Marca Ancona, RN  Flowsheets (Taken 03/07/2021 1859)  Pain at adequate level as identified by patient:   Identify patient comfort function goal   Assess for risk of opioid induced respiratory depression, including snoring/sleep apnea. Alert healthcare team of risk factors identified.   Assess pain on admission, during daily assessment and/or before any "as needed" intervention(s)   Reassess pain within 30-60 minutes of any procedure/intervention, per Pain Assessment, Intervention, Reassessment (AIR) Cycle   Evaluate if patient comfort function goal is met     Problem: Side Effects from Pain Analgesia  Goal: Patient will experience minimal side effects of analgesic therapy  03/07/2021 1859 by Marca Ancona, RN  Outcome: Progressing  03/07/2021 1859 by Marca Ancona, RN  Flowsheets (Taken 03/07/2021 1859)  Patient will experience minimal side effects of analgesic therapy:   Monitor/assess patient's respiratory status (RR depth, effort, breath sounds)   Assess for changes in cognitive function   Prevent/manage side  effects per LIP orders (i.e. nausea, vomiting, pruritus, constipation, urinary retention, etc.)   Evaluate for opioid-induced sedation with appropriate assessment tool (i.e. POSS)     Problem: Discharge Barriers  Goal: Patient will be discharged home or other facility with appropriate resources  03/07/2021 1859 by Marca Ancona, RN  Outcome: Progressing  03/07/2021 1859 by Marca Ancona, RN  Flowsheets (Taken 03/07/2021 1859)  Discharge to home or other facility with appropriate resources:   Provide appropriate patient education   Provide information on available health resources   Initiate discharge planning     Problem: Psychosocial and Spiritual Needs  Goal: Demonstrates ability to cope with hospitalization/illness  03/07/2021 1859 by Marca Ancona, RN  Outcome: Progressing  03/07/2021 1859 by Marca Ancona, RN  Flowsheets (Taken 03/07/2021 1859)  Demonstrates ability to cope with hospitalizations/illness:   Encourage verbalization of feelings/concerns/expectations   Provide quiet environment   Assist patient to identify own strengths and abilities   Encourage patient to set small goals for self     Problem: Chest Pain  Goal: Vital signs and cardiac rhythm stable  Outcome: Progressing  Flowsheets (Taken 03/07/2021 1859)  Vital signs and cardiac rhythm stable:   Monitor Craig Ford vital signs/cardiac rhythms   Assess the need for oxygen therapy and administer as ordered   Monitor labs  Goal: Cardiac pain management  Outcome: Progressing  Flowsheets (Taken 03/07/2021 1859)  Cardiac pain management:   Assess/report chest pain/or related discomfort to LIP immediately   Instruct patient to report any change in pain status   Assess pain/or related discomfort on admission, during daily assessment, before and after any intervention   Include patient and patient care companion in decisions related to pain management  Goal: Anxiety management/effective coping  Outcome: Progressing  Flowsheets (Taken 03/07/2021 1859)  Anxiety  management/effective coping:   Assess/report uncontrolled anxiety, depression or ineffective coping to LIP   Offer reassurance to decrease anxiety   Encourage patient to immediately report any increase in anxiety and/or depression   Include patient in decision making of their care and give updates on their health status  Goal: Patient/Patient Care Companion demonstrates understanding of disease process, treatment plan, medications, and discharge plan  Outcome: Progressing  Flowsheets (Taken 03/07/2021 1859)  Patient/Patient Care Companion demonstrates understanding of disease process, treatment plan, medications and discharge plan:   Educate patient to immediately report any chest pain/equivalent to RN   Reinforce with patient their activity level  and to avoid Valsalva   Assist patient/patient care companion to identify measures for cardiac risk factor management   Consult/collaborate with Cardiac Rehabilitation   Assess need for smoking cessation and substance abuse counseling and refer as needed   Educate patient/patient care companion regarding identified learning needs

## 2021-03-07 NOTE — ED Provider Notes (Signed)
EMERGENCY DEPARTMENT HISTORY AND PHYSICAL EXAM     None        Date: 03/07/2021  Patient Name: Craig Ford    History of Presenting Illness     Chief Complaint   Patient presents with    Chest Pain    Numbness     Left hand        History Provided By: patient    History: Mearl Olver is a 60 y.o. male presenting to the ED with moderate severity, intermittent, squeezing/uncomfortable left-sided chest pain with associated numbness tingling in the left arm that started while patient was buffing floors yesterday morning.  Pain has been intermittent since onset and last for few minutes at a time.  No clear exacerbating or alleviating factors to symptoms.  No associated shortness of breath, nausea, abdominal pain, vomiting, neck pain, or back pain.  No headache.  He denies history of heart disease but states he has history of hypertension, hyperlipidemia, and diabetes.  Former smoker.  No history DVT or PE.  No recent travel or surgery.  No sick contacts or COVID-positive contacts.  No recent illness.  Patient denies any chest pain at this time.  Last time he had chest pain was earlier this morning.      PCP: Pcp, None, MD  SPECIALISTS:    Current Facility-Administered Medications   Medication Dose Route Frequency Provider Last Rate Last Admin    aspirin chewable tablet 324 mg  324 mg Oral Once Maryella Shivers, MD        labetalol (NORMODYNE,TRANDATE) injection 10 mg  10 mg Intravenous Once Maryella Shivers, MD         Current Outpatient Medications   Medication Sig Dispense Refill    aspirin EC 81 MG EC tablet Take 81 mg by mouth daily      metFORMIN (GLUCOPHAGE) 1000 MG tablet Take 1,000 mg by mouth 2 (two) times daily with meals         Past History     Past Medical History:  Past Medical History:   Diagnosis Date    Diabetes mellitus     Enlarged prostate     Hyperlipidemia     Hypertension        Past Surgical History:  Past Surgical History:   Procedure Laterality Date    SKIN GRAFT FULL THICKNESS LEG          Family History:  History reviewed. No pertinent family history.    Social History:  Social History     Tobacco Use    Smoking status: Former     Types: Cigarettes    Smokeless tobacco: Never   Vaping Use    Vaping Use: Never used   Substance Use Topics    Alcohol use: Not Currently     Comment: 32 years ago    Drug use: Not Currently     Comment: marijuana       Allergies:  No Known Allergies    Review of Systems     Review of Systems: All other systems reviewed and negative.         Physical Exam   BP (!) 161/94   Pulse 70   Temp 98.6 F (37 C) (Oral)   Resp 20   Ht 5\' 9"  (1.753 m)   Wt 88.5 kg   SpO2 99%   BMI 28.80 kg/m     Constitutional: Vital signs reviewed. Well appearing. No distress.  Head: Normocephalic, atraumatic  Eyes: Conjunctiva and sclera are normal.  No injection or discharge.  Ears, Nose, Throat:  Normal external examination of the nose and ears. No throat or oropharyngeal swelling or erythema. Midline uvula. Mucous membranes moist.  Neck: Normal range of motion. Supple, no meningeal signs. Trachea midline. No stridor. No JVD  Respiratory/Chest: Clear to auscultation. No respiratory distress.   Cardiovascular: Regular rate and rhythm. No murmurs.  Abdomen:  Bowel sounds intact. No rebound or guarding. Soft.  Non-tender.  Back: No CVA tenderness to percussion. No focal tenderness.  Upper Extremity:  No edema. No cyanosis. Bilateral radial pulses intact and equal.   Lower Extremity:  No edema. No cyanosis. Bilateral calves symmetrical and non-tender. Bilateral femoral, DP, PT pulses intact and equal.  Skin: Warm and dry. No rash.  Neuro: Cranial nerves grossly intact.  Moves all extremities spontaneously.  Psychiatric: Normal affect.  Normal insight.      Diagnostic Study Results     Labs -     Results       Procedure Component Value Units Date/Time    COVID-19 (SARS-CoV-2) [[id number]] Collected: 03/07/21 0648     Updated: 03/07/21 0755     Purpose of COVID testing Screening      SARS-CoV-2 Specimen Source Nasopharyngeal     SARS CoV 2 Overall Result Not Detected    Troponin I [[id number]] Collected: 03/07/21 0648    Specimen: Blood Updated: 03/07/21 0718     Troponin I <0.01 ng/mL     CBC and differential [[id number]] Collected: 03/07/21 0648    Specimen: Blood Updated: 03/07/21 0705     WBC 4.08 x10 3/uL      Hgb 14.9 g/dL      Hematocrit 96.2 %      Platelets 214 x10 3/uL      RBC 5.01 x10 6/uL      MCV 90.4 fL      MCH 29.7 pg      MCHC 32.9 g/dL      RDW 13 %      MPV 12.1 fL      Neutrophils 39.3 %      Lymphocytes Automated 44.6 %      Monocytes 11.5 %      Eosinophils Automated 3.4 %      Basophils Automated 1.0 %      Immature Granulocytes 0.2 %      Nucleated RBC 0.0 /100 WBC      Neutrophils Absolute 1.60 x10 3/uL      Lymphocytes Absolute Automated 1.82 x10 3/uL      Monocytes Absolute Automated 0.47 x10 3/uL      Eosinophils Absolute Automated 0.14 x10 3/uL      Basophils Absolute Automated 0.04 x10 3/uL      Immature Granulocytes Absolute 0.01 x10 3/uL      Absolute NRBC 0.00 x10 3/uL             Radiologic Studies -   Radiology Results (24 Hour)       Procedure Component Value Units Date/Time    Chest AP Portable [[id number]] Collected: 03/07/21 0737    Order Status: Completed Updated: 03/07/21 0739    Narrative:      EXAM: CHEST RADIOGRAPH    CLINICAL HISTORY: 60 year old man, chest pain    TECHNIQUE: Portable AP view of the chest    COMPARISON: None available at this time      FINDINGS:    Lines/tubes: None.    Lungs/pleura: There is no  focal consolidation.  There is no pleural  effusion or pneumothorax.    Cardiomediastinal silhouette: Normal in width and contour.    Bones: Unremarkable.      Impression:          No focal consolidation, pleural effusion, or pneumothorax.    Raynelle Dick, MD   03/07/2021 7:37 AM    CT Head without Contrast [[id number]] Collected: 03/07/21 0641    Order Status: Completed Updated: 03/07/21 0643    Narrative:      History: Numbness.    Comparisons:  None.    Technique: Axial CT brain obtained from vertex to skull base without  contrast.     A combination of automatic exposure control, adjustment of the mA and/or  kV according to patient size and/or use of iterative reconstruction  technique was utilized.    Findings:  Gray-white matter differentiation preserved. Cerebral atrophy. White  matter changes of chronic ischemic small vessel disease.    No mass, mass effect, or midline shift. No intra-axial or extra-axial  hemorrhage or collection.    Ventricles, sulci, cisterns age-appropriate in size and configuration.    Calvarium appears intact.    Soft tissue unremarkable.      Impression:         No acute intracranial abnormality.    Recommendation:  None.    Adaline Sill, MD   03/07/2021 6:41 AM        .    Medical Decision Making   I am the first provider for this patient.    I reviewed the vital signs, available nursing notes, past medical history, past surgical history, family history and social history.    Vital Signs-Reviewed the patient's vital signs.   Patient Vitals for the past 12 hrs:   BP Temp Pulse Resp   03/07/21 0700 (!) 161/94 -- 70 20   03/07/21 0640 162/90 -- 74 14   03/07/21 0610 (!) 170/104 98.6 F (37 C) 75 18         EKG:  Interpreted by the EP.   Time Interpreted: 620   Rate: 76   Rhythm: NSR    Interpretation: no STEMI   Comparison: none       ED Course:     817 - [facility] consulted    Provider Notes: HEART score 4. Low suspicion for aortic dissection based on equal bilateral radial pulses, no mediastinal widening on CXR, and patient's description of symptoms. No pulsatile abdominal mass or abdominal tenderness to suggest AAA. Wells score zero,   low suspicion for PE. No infectious complaints or risk factors to suggest pericarditis or endocarditis. No JVD, SOB, or cardiac enlargement on CXR to suggest tamponade. No PNA or PTX on CXR.            Diagnosis     Clinical Impression:   1. Chest pain        Treatment Plan:   ED Disposition        ED Disposition   Observation    Condition   --    Date/Time   Mon Mar 07, 2021  8:43 AM    Comment   Admitting Physician: Viviann Spare [[id number]]   Service:: Medicine [[id number]]   Estimated Length of Stay: < 2 midnights   Tentative Discharge Plan?: Home or Self Care [1]   Does patient need telemetry?: Yes   Telemetry type (separate Telemetry order is also required):: Medical telemetry  _______________________________    This note was generated by the Epic EMR system/ Dragon speech recognition and may contain inherent errors or omissions not intended by the user. Grammatical errors, random word insertions, deletions and pronoun errors  are occasional consequences of this technology due to software limitations. Not all errors are caught or corrected. If there are questions or concerns about the content of this note or information contained within the body of this dictation they should be addressed directly with the author for clarification.      Attestations: This note is prepared by Lynnea Ferrier, MD    _______________________________       Maryella Shivers, MD  03/07/21 [phone]

## 2021-03-07 NOTE — ED Triage Notes (Signed)
Pt reports to ED with C/C of CP and left hand numbness. Onset of Sx is yesterday. Pt denies SOB, unilateral weakness and slurred speech. PMH of HTN, DM.

## 2021-03-07 NOTE — H&P (Signed)
[facility] ADMISSION HISTORY AND PHYSICAL EXAM    Date Time: 03/07/21 10:00 AM  Patient Name: Craig Ford  Attending Physician: Viviann Spare, MDMD  Primary Care Physician: Pcp, None, MD    CC:   Chief Complaint   Patient presents with    Chest Pain    Numbness     Left hand        History of Presenting Illness:   Craig Ford is a 60 y.o. male with multiple past medical history including hypertension, hyperlipidemia, diabetes mellitus type 2, former smoker who presented to the hospital with complaints of intermittent chest pain starting 1 day back, patient said the pain is in the left upper chest, comes and lasts about 10 minutes and goes away, nonradiating, pain scale of 10/10, no diaphoresis associated with it, no shortness of breath, no cough, no associated symptoms with exertion, has been coming and going throughout the day on 03/06/2021 without any relief, denies any trauma to the chest.  With that he decided to come to the hospital for further work-up.     Patient used to smoke tobacco but quit 20 years ago, denies any alcohol use, with no associated fever, diarrhea, urinary complaints, loss of consciousness, no weakness between left and right side of the body.     Of the patient was worked up in the emergency room is getting admitted to the hospital service for further work-up and management.     Past Medical History:     Past Medical History:   Diagnosis Date    Diabetes mellitus     Enlarged prostate     Hyperlipidemia     Hypertension        Past Surgical History:     Past Surgical History:   Procedure Laterality Date    SKIN GRAFT FULL THICKNESS LEG         Family History:   History reviewed. No pertinent family history.    Social History:     Social History     Socioeconomic History    Marital status: Single     Spouse name: Not on file    Number of children: Not on file    Years of education: Not on file    Highest education level: Not on file   Occupational History     Not on file   Tobacco Use    Smoking status: Former     Types: Cigarettes    Smokeless tobacco: Never   Vaping Use    Vaping Use: Never used   Substance and Sexual Activity    Alcohol use: Not Currently     Comment: 32 years ago    Drug use: Not Currently     Comment: marijuana    Sexual activity: Not on file   Other Topics Concern    Not on file   Social History Narrative    Not on file     Social Determinants of Health     Financial Resource Strain: Not on file   Food Insecurity: Not on file   Transportation Needs: Not on file   Physical Activity: Not on file   Stress: Not on file   Social Connections: Not on file   Intimate Partner Violence: Not on file   Housing Stability: Not on file       Allergies:   No Known Allergies    Medications:   (Not in a hospital admission)      Current Facility-Administered  Medications   Medication Dose Route Frequency    atorvastatin  40 mg Oral QHS    enoxaparin  40 mg Subcutaneous Daily    metoprolol succinate XL  25 mg Oral Daily       Review of Systems:   Comprehensive review of more than 10 systems including constitutional, eyes, ears, nose, mouth, throat, cardiovascular, GI, GU, musculoskeletal, integumentary, respiratory, neurologic, psychiatric, and endocrine is negative other than what is mentioned already in the history of present illness.  Physical Exam:     Vitals:    03/07/21 0640 03/07/21 0700 03/07/21 0800 03/07/21 0900   BP: 162/90 (!) 161/94 146/82 157/88   Pulse: 74 70 60 63   Resp: 14 20 14 14    Temp:       TempSrc:       SpO2: 99% 99% 97% 99%   Weight:       Height:           Intake and Output Summary (Last 24 hours) at Date Time  No intake or output data in the 24 hours ending 03/07/21 1000    General: awake, alert, oriented x 3; no acute distress.  HEENT: perrla, eomi, sclera anicteric  oropharynx clear without lesions, mucous membranes are moist.  Neck: supple, no lymphadenopathy, no thyromegaly, no JVD, no carotid bruits  Cardiovascular: regular rate and  rhythm, no murmurs, rubs or gallops  Lungs: clear to auscultation bilaterally, without wheezing, rhonchi, or rales  Abdomen: soft, non-tender, non-distended; no palpable masses, no hepatosplenomegaly, normoactive bowel sounds, no rebound or guarding  Extremities: no clubbing, cyanosis, or edema  Neuro: cranial nerves grossly intact, strength 5/5 in upper and lower extremities, sensation intact.  Skin: no rashes or lesions noted  GU: no CVA tenderness       Labs:     Results       Procedure Component Value Units Date/Time    Troponin I [[id number]] Collected: 03/07/21 0923     Updated: 03/07/21 0958     Troponin I <0.01 ng/mL     Comprehensive metabolic panel [[id number]] Collected: 03/07/21 0923    Specimen: Blood Updated: 03/07/21 0934    Troponin I [[id number]] Collected: 03/07/21 0923    Specimen: Blood Updated: 03/07/21 0923    COVID-19 (SARS-CoV-2) [[id number]] Collected: 03/07/21 0648     Updated: 03/07/21 0755     Purpose of COVID testing Screening     SARS-CoV-2 Specimen Source Nasopharyngeal     SARS CoV 2 Overall Result Not Detected    Troponin I [[id number]] Collected: 03/07/21 0648    Specimen: Blood Updated: 03/07/21 0718     Troponin I <0.01 ng/mL     CBC and differential [[id number]] Collected: 03/07/21 0648    Specimen: Blood Updated: 03/07/21 0705     WBC 4.08 x10 3/uL      Hgb 14.9 g/dL      Hematocrit 53.6 %      Platelets 214 x10 3/uL      RBC 5.01 x10 6/uL      MCV 90.4 fL      MCH 29.7 pg      MCHC 32.9 g/dL      RDW 13 %      MPV 12.1 fL      Neutrophils 39.3 %      Lymphocytes Automated 44.6 %      Monocytes 11.5 %      Eosinophils Automated 3.4 %      Basophils Automated 1.0 %  Immature Granulocytes 0.2 %      Nucleated RBC 0.0 /100 WBC      Neutrophils Absolute 1.60 x10 3/uL      Lymphocytes Absolute Automated 1.82 x10 3/uL      Monocytes Absolute Automated 0.47 x10 3/uL      Eosinophils Absolute Automated 0.14 x10 3/uL      Basophils Absolute Automated 0.04 x10 3/uL      Immature  Granulocytes Absolute 0.01 x10 3/uL      Absolute NRBC 0.00 x10 3/uL             Radiology Results (24 Hour)       Procedure Component Value Units Date/Time    Chest AP Portable [[id number]] Collected: 03/07/21 0737    Order Status: Completed Updated: 03/07/21 0739    Narrative:      EXAM: CHEST RADIOGRAPH    CLINICAL HISTORY: 59 year old man, chest pain    TECHNIQUE: Portable AP view of the chest    COMPARISON: None available at this time      FINDINGS:    Lines/tubes: None.    Lungs/pleura: There is no focal consolidation.  There is no pleural  effusion or pneumothorax.    Cardiomediastinal silhouette: Normal in width and contour.    Bones: Unremarkable.      Impression:          No focal consolidation, pleural effusion, or pneumothorax.    Raynelle Dick, MD   03/07/2021 7:37 AM    CT Head without Contrast [[id number]] Collected: 03/07/21 0641    Order Status: Completed Updated: 03/07/21 0643    Narrative:      History: Numbness.    Comparisons: None.    Technique: Axial CT brain obtained from vertex to skull base without  contrast.     A combination of automatic exposure control, adjustment of the mA and/or  kV according to patient size and/or use of iterative reconstruction  technique was utilized.    Findings:  Gray-white matter differentiation preserved. Cerebral atrophy. White  matter changes of chronic ischemic small vessel disease.    No mass, mass effect, or midline shift. No intra-axial or extra-axial  hemorrhage or collection.    Ventricles, sulci, cisterns age-appropriate in size and configuration.    Calvarium appears intact.    Soft tissue unremarkable.      Impression:         No acute intracranial abnormality.    Recommendation:  None.    Adaline Sill, MD   03/07/2021 6:41 AM               Assessment:     Patient Active Problem List   Diagnosis    Chest pain    Primary hypertension    Hyperlipidemia    Type 2 diabetes mellitus without complication, without long-term current use of insulin       Plan:    Chest pain - presented to the hospital with intermittent chest pain for the past 2 days, has multiple risk factors including diabetes, hypertension and hyperlipidemia, troponins are negative, EKG is normal, continue monitoring on telemetry, seen by cardiology team who recommended getting coronary angiogram, following closely.   Diabetes mellitus type 2 - continue sliding scale coverage, check hemoglobin A1c, continue monitoring.   Hypertension -continue blood pressure medications.   Hyperlipidemia -continue lipid medications.     Status/Rationale: Stable    Signed by: Viviann Spare, MD, MD   Cc:Pcp, None, MD  The review of the patient's medications does not in any way constitute an endorsement, by this clinician,  of their use, dosage, indications, route, efficacy, interactions, or other clinical parameters.     This note was generated within the [employer] EMR using Dragon medical speech recognition software and may contain inherent errors or omissions not intended by the user. Grammatical and punctuation errors, random word insertions, deletions, pronoun errors and incomplete sentences are occasional consequences of this technology due to software limitations. Not all errors are caught or corrected.  Although every attempt is made to root out erroneus and incomplete transcription, the note may still not fully represent the intent or opinion of the author. If there are questions or concerns about the content of this note or information contained within the body of this dictation they should be addressed directly with the author for clarification.

## 2021-03-08 DIAGNOSIS — R079 Chest pain, unspecified: Secondary | ICD-10-CM

## 2021-03-08 LAB — HEMOGLOBIN A1C
Average Estimated Glucose: 137 mg/dL
Hemoglobin A1C: 6.4 % — ABNORMAL HIGH (ref 4.6–5.9)

## 2021-03-08 LAB — LIPID PANEL
Cholesterol / HDL Ratio: 4.1 Index
Cholesterol: 185 mg/dL (ref 0–199)
HDL: 45 mg/dL (ref 40–9999)
LDL Calculated: 125 mg/dL — ABNORMAL HIGH (ref 0–99)
Triglycerides: 76 mg/dL (ref 34–149)
VLDL Calculated: 15 mg/dL (ref 10–40)

## 2021-03-08 LAB — GLUCOSE WHOLE BLOOD - POCT: Whole Blood Glucose POCT: 128 mg/dL — ABNORMAL HIGH (ref 70–100)

## 2021-03-08 LAB — TSH: TSH: 1.19 u[IU]/mL (ref 0.35–4.94)

## 2021-03-08 MED ORDER — PANTOPRAZOLE SODIUM 40 MG PO TBEC
40.0000 mg | DELAYED_RELEASE_TABLET | Freq: Every day | ORAL | 0 refills | Status: AC
Start: 2021-03-08 — End: ?

## 2021-03-08 NOTE — UM Notes (Signed)
[facility] Utilization Review NPI: [id number], Tax ID: [id number] Please Call [phone] with any questions or concerns. Confidential voicemail. Fax final authorizations and requests for additional information to [phone]    03/07/21 0843    Admit to Observation  Once       Comments: Remote tele  Diagnosis: Chest Pain   Level of Care: Acute   Patient Class: Observation   Admitting Physician Kari Baars A       CC: intermittent chest pain starting 1 day back, patient said the pain is in the left upper chest, comes and lasts about 10 minutes and goes away, nonradiating, pain scale of 10/10, no diaphoresis associated with it, no shortness of breath, no cough, no associated symptoms with exertion, has been coming and going throughout the day on 03/06/2021 without any relief, denies any trauma to the chest    Cardiology Consult:  Assessment:  ? Chest pain  ? Hypertension  ? Hyperlipidemia  ? Type 2 diabetes  ? Former smoker     Recommendations:        60 year old male with past medical history of hypertension, hyperlipidemia, type 2 diabetes, and former smoker coming in with chest pain.  Patient's chest pain is atypical with a normal EKG and negative troponin x1.  He does have multiple risk factors for coronary disease.  We will proceed with coronary CT angiogram for further risk factor stratification.  I will add 40 mg of atorvastatin, aspirin and 25 mg of metoprolol to his medications.  He should also get an echocardiogram, although this can be done as an outpatient if his CTA is normal.  Case discussed with ER attending.  Patient potentially discharge later today if nonobstructive coronary disease and no recurrence of chest pain.           Past Medical History:  Diagnosis Date   Diabetes mellitus    Enlarged prostate    Hyperlipidemia    Hypertension     VS: 98.6-70-20 161/94 sats 99    Labs: Glu 113,     Troponin neg x4    EKG: NSR    CTA Coronary:  1. No significant coronary artery  stenosis.  2. Left ventricular ejection fraction 54%     CT Head: non-acute    CXR: neg      Scheduled Meds:  Current Facility-Administered Medications  Medication Dose Route Frequency   atorvastatin  40 mg Oral QHS   enoxaparin  40 mg Subcutaneous Daily   insulin lispro  1-3 Units Subcutaneous QHS   insulin lispro  1-5 Units Subcutaneous TID AC   metoprolol succinate XL  25 mg Oral Daily    Continuous Infusions:  PRN Meds:.alum & mag hydroxide-simethicone, Nursing communication: Adult Hypoglycemia Treatment Algorithm **AND** glucagon (rDNA) **AND** dextrose **AND** dextrose **AND** dextrose, famotidine, lidocaine viscous, nitroglycerin, ondansetron    Assessment:     Patient Active Problem List  Diagnosis   Chest pain   Primary hypertension   Hyperlipidemia   Type 2 diabetes mellitus without complication, without long-term current use of insulin        Plan:  ? Chest pain - presented to the hospital with intermittent chest pain for the past 2 days, has multiple risk factors including diabetes, hypertension and hyperlipidemia, troponins are negative, EKG is normal, continue monitoring on telemetry, seen by cardiology team who recommended getting coronary angiogram, following closely.   ? Diabetes mellitus type 2 - continue sliding scale coverage, check hemoglobin A1c, continue monitoring.   ?  Hypertension -continue blood pressure medications.   ? Hyperlipidemia -continue lipid medications

## 2021-03-08 NOTE — Progress Notes (Signed)
Patient was discharged from [address] [facility], transported by the  nurse. IV and telemetry were removed. VSS. Medication, discharge and follow-up instructions were given to the patient. All questions and concerns were answered. Medication prescriptions were given to the patient. All belongings were with the patient upon discharge. The pt was able to teach back education.

## 2021-03-08 NOTE — Plan of Care (Signed)
NURSING SHIFT NOTE     Patient: Craig Ford  Day: 0      SHIFT EVENTS     Shift Narrative/Significant Events (PRN med administration, fall, RRT, etc.):     Received in bed awake and oriented, denies chest pain and discomfort, no desaturation and SOB noted, good appetite. Safety and fall precautions remain in place. Purposeful rounding completed.          ASSESSMENT     Changes in assessment from patient's baseline this shift:    Neuro: No  CV: No  Pulm: No  Peripheral Vascular: No  HEENT: No  GI: No  BM during shift: No, Last BM:  03/06/2021  GU: No   Integ: No  MS: No    Pain: None  Pain Interventions: Rest               Lines     Patient Lines/Drains/Airways Status       Active Lines, Drains and Airways       Name Placement date Placement time Site Days    Peripheral IV 03/07/21 20 G Right Antecubital 03/07/21  0652  Antecubital  1    Peripheral IV 03/07/21 20 G Left Antecubital 03/07/21  1908  Antecubital  less than 1    Peripheral IV 03/07/21 22 G Distal;Posterior;Right Upper Arm 03/07/21  1600  Upper Arm  less than 1                         VITAL SIGNS     Vitals:    03/08/21 0847   BP: 149/87   Pulse: 67   Resp:    Temp:    SpO2:        Temp  Min: 97.5 F (36.4 C)  Max: 98.4 F (36.9 C)  Pulse  Min: 60  Max: 76  Resp  Min: 11  Max: 19  BP  Min: 128/80  Max: 163/90  SpO2  Min: 96 %  Max: 100 %      Intake/Output Summary (Last 24 hours) at 03/08/2021 1050  Last data filed at 03/08/2021 0600  Gross per 24 hour   Intake 342 ml   Output --   Net 342 ml            CARE PLAN        Problem: Safety  Goal: Patient will be free from injury during hospitalization  Outcome: Progressing  Flowsheets (Taken 03/07/2021 2228 by Loreen Freud, RN)  Patient will be free from injury during hospitalization:   Assess patient's risk for falls and implement fall prevention plan of care per policy   Provide and maintain safe environment   Use appropriate transfer methods   Ensure appropriate safety devices are available at the  bedside   Include patient/ family/ care giver in decisions related to safety   Hourly rounding  Goal: Patient will be free from infection during hospitalization  Outcome: Progressing  Flowsheets (Taken 03/07/2021 1859)  Free from Infection during hospitalization:   Monitor lab/diagnostic results   Assess and monitor for signs and symptoms of infection   Monitor all insertion sites (i.e. indwelling lines, tubes, urinary catheters, and drains)   Encourage patient and family to use good hand hygiene technique     Problem: Pain  Goal: Pain at adequate level as identified by patient  Outcome: Progressing  Flowsheets (Taken 03/07/2021 1859)  Pain at adequate level as identified by patient:   Identify  patient comfort function goal   Assess for risk of opioid induced respiratory depression, including snoring/sleep apnea. Alert healthcare team of risk factors identified.   Assess pain on admission, during daily assessment and/or before any "as needed" intervention(s)   Reassess pain within 30-60 minutes of any procedure/intervention, per Pain Assessment, Intervention, Reassessment (AIR) Cycle   Evaluate if patient comfort function goal is met     Problem: Side Effects from Pain Analgesia  Goal: Patient will experience minimal side effects of analgesic therapy  Outcome: Progressing  Flowsheets (Taken 03/07/2021 1859)  Patient will experience minimal side effects of analgesic therapy:   Monitor/assess patient's respiratory status (RR depth, effort, breath sounds)   Assess for changes in cognitive function   Prevent/manage side effects per LIP orders (i.e. nausea, vomiting, pruritus, constipation, urinary retention, etc.)   Evaluate for opioid-induced sedation with appropriate assessment tool (i.e. POSS)     Problem: Discharge Barriers  Goal: Patient will be discharged home or other facility with appropriate resources  Outcome: Progressing     Problem: Psychosocial and Spiritual Needs  Goal: Demonstrates ability to cope with  hospitalization/illness  Outcome: Progressing  Flowsheets (Taken 03/07/2021 1859)  Demonstrates ability to cope with hospitalizations/illness:   Encourage verbalization of feelings/concerns/expectations   Provide quiet environment   Assist patient to identify own strengths and abilities   Encourage patient to set small goals for self     Problem: Chest Pain  Goal: Vital signs and cardiac rhythm stable  Outcome: Progressing  Flowsheets (Taken 03/07/2021 2228 by Loreen Freud, RN)  Vital signs and cardiac rhythm stable:   Monitor /assess vital signs/cardiac rhythms   Monitor labs   Assess the need for oxygen therapy and administer as ordered  Goal: Cardiac pain management  Outcome: Progressing  Flowsheets (Taken 03/07/2021 2228 by Loreen Freud, RN)  Cardiac pain management:   Assess/report chest pain/or related discomfort to LIP immediately   Instruct patient to report any change in pain status   Assess pain/or related discomfort on admission, during daily assessment, before and after any intervention   Include patient and patient care companion in decisions related to pain management  Goal: Anxiety management/effective coping  Outcome: Progressing  Flowsheets (Taken 03/07/2021 2228 by Loreen Freud, RN)  Anxiety management/effective coping:   Assess/report uncontrolled anxiety, depression or ineffective coping to LIP   Offer reassurance to decrease anxiety   Encourage patient to immediately report any increase in anxiety and/or depression   Include patient in decision making of their care and give updates on their health status  Goal: Patient/Patient Care Companion demonstrates understanding of disease process, treatment plan, medications, and discharge plan  Outcome: Progressing  Flowsheets (Taken 03/07/2021 1859)  Patient/Patient Care Companion demonstrates understanding of disease process, treatment plan, medications and discharge plan:   Educate patient to immediately report any chest pain/equivalent to RN   Reinforce  with patient their activity level and to avoid Valsalva   Assist patient/patient care companion to identify measures for cardiac risk factor management   Consult/collaborate with Cardiac Rehabilitation   Assess need for smoking cessation and substance abuse counseling and refer as needed   Educate patient/patient care companion regarding identified learning needs

## 2021-03-08 NOTE — Discharge Summary (Signed)
MEDICINE DISCHARGE SUMMARY    Date Time: 03/08/21 10:04 AM  Patient Name: Craig Ford  Attending Physician: Gertie Baron, MD  Primary Care Physician: Marisa Sprinkles, MD    Date of Admission: 03/07/2021  Date of Discharge:     Discharge Diagnoses:     Principal Problem:    Chest pain  Active Problems:    Primary hypertension    Hyperlipidemia    Type 2 diabetes mellitus without complication, without long-term current use of insulin  Resolved Problems:    * No resolved hospital problems. *      Disposition:     Home/SNF/Acute rehab    Pending Results, Recommendations & Instructions to providers after discharge:     Micro / Labs / Path pending: none    Recent Labs - Last 2:       Recent Labs   Lab 03/07/21  0648   WBC 4.08   Hgb 14.9   Hematocrit 45.3   Platelets 214           Recent Labs   Lab 03/07/21  0923   Alkaline Phosphatase 38   Bilirubin, Total 0.6   Protein, Total 7.1   Albumin 4.0   ALT 11   AST (SGOT) 14      Recent Labs   Lab 03/07/21  0923   Sodium 141   Potassium 4.7   Chloride 105   CO2 26   BUN 13.0   Glucose 113*   Calcium 9.6       Recent Labs   Lab 03/08/21  0321   Cholesterol 185   Triglycerides 76   HDL 45   LDL Calculated 125*     Recent Labs   Lab 03/08/21  0321   TSH 1.19            Procedures/Radiology performed:     XR CHEST AP PORTABLE  CT HEAD WO CONTRAST  CT [facility] CORONARY       Hospital Course:     Reason for admission/ HPI/Hospital Course: as per admitting MD, Craig Ford is a 60 y.o. male with multiple past medical history including hypertension, hyperlipidemia, diabetes mellitus type 2, former smoker who presented to the hospital with complaints of intermittent chest pain starting 1 day back, patient said the pain is in the left upper chest, comes and lasts about 10 minutes and goes away, nonradiating, pain scale of 10/10, no diaphoresis associated with it, no shortness of breath, no cough, no associated symptoms with exertion, has been coming and going throughout the  day on 03/06/2021 without any relief, denies any trauma to the chest.  With that he decided to come to the hospital for further work-up.      Patient used to smoke tobacco but quit 20 years ago, denies any alcohol use, with no associated fever, diarrhea, urinary complaints, loss of consciousness, no weakness between left and right side of the body.        plan    Atypical chest pain - presented to the hospital with intermittent chest pain for the past 2 days, has multiple risk factors including diabetes, hypertension and hyperlipidemia, troponins are negative, EKG is normal, continued monitoring on telemetry, seen by cardiology team who recommended getting coronary angiogram--> negative study. His troponin X 3--> negative, going to start PPI, seen by on-call cardiologist --> commend to discharge patient home and follow-up as outpatient if needed.  Strongly advised to follow-up with PCP in 2 weeks, discussed about medication compliance  and regular follow-up with PCP.   Diabetes mellitus type 2 - continue sliding scale coverage, A1c -6.4, encouraged to continue current home medication .   Hypertension -continue home blood pressure medications.  Closely monitor blood pressure at home and discuss with PCP  Hyperlipidemia -continue lipid medications.  Reviewed lipid profile    Discharge Day Exam:  Temp:  [97.5 F (36.4 C)-98.4 F (36.9 C)] 98.2 F (36.8 C)  Heart Rate:  [60-76] 67  Resp Rate:  [11-19] 19  BP: (128-163)/(72-90) 149/87    General: No apparent distress, comfortable appearing, vital signs noted   Eyes: Anicteric sclerae, moist conjunctiva, PERRLA   ENT: Oropharynx clear; no erythema or exudates;  Neck supple; FROM; no mass   Resp: Clear to ausculation, no wheezing, rhonci, or rales noted; normal respiratory effort   CV: Regular rate and rhythm, no murmurs, gallops, or rubs; no peripheral edema   GI Abdomen soft, non-tender, no masses; no hepatosplenomegaly   Skin: Normal temperature, tone, turgor; no rash,  lesions, ulcers   Musc: No digital cyanosis or ischemia   Neuro: CN 2-12 grossly intact, moves all extremities- 5/5 in all extremities        Consultations:     Treatment Team: Attending Provider: Gertie Baron, MD; Technician: Rosiland Oz, Nelva Bush; Registered Nurse: Marca Ancona, RN; Registered Nurse: Enid Derry, RN; Technician: Merrily Pew    Discharge Condition:     Improved      Discharge Instructions & Follow Up Plan for Patient:     Diet: diabetic diet    Activity/Weight Bearing Status: as tolerated with fall and aspiration precautions    Patient was instructed to follow up with:   Primary Care Doctor Pcp, None, MD in 2 weeks       Discharge Code Status: full code    Minutes spent coordinating discharge and reviewing discharge plan: 33 minutes    Discharge Medications:        Medication List        START taking these medications      pantoprazole 40 MG tablet  Commonly known as: PROTONIX  Take 1 tablet (40 mg total) by mouth daily            CONTINUE taking these medications      amLODIPine 10 MG tablet  Commonly known as: NORVASC     aspirin EC 81 MG EC tablet     atorvastatin 20 MG tablet  Commonly known as: LIPITOR     celecoxib 200 MG capsule  Commonly known as: CeleBREX     hydroCHLOROthiazide 12.5 MG capsule  Commonly known as: MICROZIDE     metFORMIN 1000 MG tablet  Commonly known as: GLUCOPHAGE     tamsulosin 0.4 MG Caps  Commonly known as: FLOMAX     vitamin D (ergocalciferol) 50000 UNIT Caps  Commonly known as: DRISDOL            STOP taking these medications      ibuprofen 800 MG tablet  Commonly known as: ADVIL               Where to Get Your Medications        These medications were sent to [id number] [place] - [address] AT [facility]  [address] [place], [place] [address]      Phone: [phone]   pantoprazole 40 MG tablet  Signed by: Gertie Baron, MD, MD    CC: Pcp, None, MD

## 2021-03-08 NOTE — Progress Notes (Signed)
[facility] PROGRESS NOTE  [facility]     Date Time: 03/08/21 9:19 AM  Patient Name: Craig Ford, Craig Ford  Date of Birth: 03-27-1961  CSN: [id number]  MRN: [id number]       Assessment:   Chest pain.  No recurrence.  Troponins negative x 5.  Coronary CTA done yesterday reveals normal coronary arteries.  Total calcium score of 0  Hypertension  Hyperlipidemia  Diabetes    Plan:   Reassuring test results discussed with Mr. Ropp.  Pain appears to be noncardiac.  Okay to discharge from a cardiac standpoint.  Cardiology follow-up as needed.    Signed by: Sandria Senter, MD    Subjective:   Denies chest pain, SOB or palpitations.    Hospital Medications:      Scheduled Meds: PRN Meds:    atorvastatin, 40 mg, Oral, QHS  enoxaparin, 40 mg, Subcutaneous, Daily  insulin lispro, 1-3 Units, Subcutaneous, QHS  insulin lispro, 1-5 Units, Subcutaneous, TID AC  metoprolol succinate XL, 25 mg, Oral, Daily        Continuous Infusions:   alum & mag hydroxide-simethicone, 30 mL, Q12H PRN  glucagon (rDNA), 1 mg, PRN   And  dextrose, 250 mL, PRN   And  dextrose, 25 g, PRN   And  dextrose, 25 g, PRN  famotidine, 20 mg, Q12H PRN  lidocaine viscous, 10 mL, Q12H PRN  nitroglycerin, 0.4 mg, Q5 Min PRN  ondansetron, 4 mg, Q8H PRN              Home Medications:     Medications Prior to Admission   Medication Sig Dispense Refill Last Dose    aspirin EC 81 MG EC tablet Take 81 mg by mouth daily   03/07/2021    atorvastatin (LIPITOR) 20 MG tablet Take 20 mg by mouth daily   03/06/2021    metFORMIN (GLUCOPHAGE) 1000 MG tablet Take 1,000 mg by mouth Once - Over 24 hr   03/06/2021    tamsulosin (FLOMAX) 0.4 MG Cap Take 0.4 mg by mouth Daily after dinner   Past Week    amLODIPine (NORVASC) 10 MG tablet Take 10 mg by mouth daily   Unknown    celecoxib (CeleBREX) 200 MG capsule Take 200 mg by mouth 2 (two) times daily   Unknown    hydroCHLOROthiazide (MICROZIDE) 12.5 MG capsule Take 12.5 mg by mouth daily   Unknown    ibuprofen (ADVIL) 800 MG  tablet Take 800 mg by mouth every 8 (eight) hours as needed for Pain   Unknown    vitamin D, ergocalciferol, (DRISDOL) 50000 UNIT Cap Take 50,000 Units by mouth once a week   Unknown         Physical Exam:     VITAL SIGNS   Temp:  [97.5 F (36.4 C)-98.4 F (36.9 C)] 98.2 F (36.8 C)  Heart Rate:  [60-76] 67  Resp Rate:  [11-19] 19  BP: (128-163)/(72-90) 149/87  POCT Glucose Result (Read Only)  Avg: 134.7  Min: 103  Max: 173  SpO2: 100 %    Intake/Output Summary (Last 24 hours) at 03/08/2021 0919  Last data filed at 03/08/2021 0600  Gross per 24 hour   Intake 342 ml   Output --   Net 342 ml          GENERAL: Patient is in no acute distress   HEENT: No scleral icterus or conjunctival pallor, moist mucous membranes   NECK: No jugular venous distention  CARDIAC: Regular rate  and rhythm, with normal S1 and S2, and no murmurs, rubs, or gallops   CHEST: Clear to auscultation bilaterally, normal respiratory effort  EXTREMITIES: No edema  PULSES: Radial pulses full and equal bilaterally.  NEUROLOGIC: Alert and oriented  MUSCULOSKELETAL: Muscle strength and tone not tested      Labs:     CBC w/Diff   Recent Labs   Lab 03/07/21  0648   WBC 4.08   Hgb 14.9   Hematocrit 45.3   Platelets 214          Comprehensive Metabolic Profile   Recent Labs   Lab 03/07/21  0923   Sodium 141   Potassium 4.7   Chloride 105   CO2 26   BUN 13.0   Creatinine 1.0   Calcium 9.6   Albumin 4.0   Protein, Total 7.1   Bilirubin, Total 0.6   Alkaline Phosphatase 38   ALT 11   AST (SGOT) 14   Glucose 113*          Cardiac Enzymes   Recent Labs   Lab 03/07/21  2105 03/07/21  1719 03/07/21  1245 03/07/21  0923 03/07/21  0648   Troponin I <0.01 <0.01 <0.01 <0.01 <0.01       No results found for: BNP       Thyroid Studies    Recent Labs   Lab 03/08/21  0321   TSH 1.19           Lipid Profile   Recent Labs   Lab 03/08/21  0321   Cholesterol 185          Coagulation Studies       No results found for: DDIMER         Telemetry:   Normal sinus rhythm    Rads:    CT Head without Contrast    Result Date: 03/07/2021  History: Numbness. Comparisons: None. Technique: Axial CT brain obtained from vertex to skull base without contrast. A combination of automatic exposure control, adjustment of the mA and/or kV according to patient size and/or use of iterative reconstruction technique was utilized. Findings: Gray-white matter differentiation preserved. Cerebral atrophy. White matter changes of chronic ischemic small vessel disease. No mass, mass effect, or midline shift. No intra-axial or extra-axial hemorrhage or collection. Ventricles, sulci, cisterns age-appropriate in size and configuration. Calvarium appears intact. Soft tissue unremarkable.      No acute intracranial abnormality. Recommendation: None. Adaline Sill, MD  03/07/2021 6:41 AM    Chest AP Portable    Result Date: 03/07/2021  EXAM: CHEST RADIOGRAPH CLINICAL HISTORY: 60 year old man, chest pain TECHNIQUE: Portable AP view of the chest COMPARISON: None available at this time  FINDINGS: Lines/tubes: None. Lungs/pleura: There is no focal consolidation.  There is no pleural effusion or pneumothorax. Cardiomediastinal silhouette: Normal in width and contour. Bones: Unremarkable.     No focal consolidation, pleural effusion, or pneumothorax. Raynelle Dick, MD  03/07/2021 7:37 AM    CT Angiogram Coronary    Result Date: 03/07/2021  CLINICAL STATEMENT: Chest pain COMPARISON: No comparison examinations are available. TECHNIQUE: Preliminary helical scanning at 2.5 mm increments acquisition  is performed for calcium score. After a test bolus of contrast repeats helical CT acquisition using 0.675 mm interval acquisition was performed using 128  slice scanner during infusion of low osmolar contrast. Total contrast volume 70  cc Omnipaque 350. Procedure is well tolerated. Imaging data is transferred to independent workstation for post processing and multiplanar 2-D and 3-D  reconstruction. The following ?dose reduction techniques  were utilized: automated exposure control and/or adjustment of the mA and/or kV according to patient size, and the use of iterative reconstruction technique. FINDINGS: CORONARY ARTERY CALCIUM (CAC) SCORING: Coronary Artery Calcium (CAC) Scoring:      Left Main Coronary Artery:                      0      Right Coronary Artery:                             0      Left Anterior Descending Artery:            0      Left Circumflex Artery:                             0           TOTAL CA SCORE: 0 Age/Gender matched score percentile: The total calcium score of 0  is between the 0 and the 25th  percentile for females  between the ages of 33 and 21. This means that, 0%  percent of people of this age and gender had less calcium than was detected in this study. IMPLICATION: No identifiable plaque. RISK OF CORONARY ARTERY DISEASE: Very low, generally less than 5%. COMMENT: Coronary artery calcium scoring assesses the statistical risk of coronary artery disease in asymptomatic patients.  Noncalcified coronary atherosclerotic plaque may also exist, even if no calcium is detected. This test should not be used as a sole method of assessing coronary risk and does not replace other diagnostic tests, laboratory analysis, or history and physical examination performed by a qualified physician. Additional testing by the patient's physician may be necessary to make definitive therapeutic decisions. CORONARY CT ANGIOGRAPHY: DOMINANCE: Right dominant  with normal origin. LEFT MAIN: No significant luminal narrowing. LEFT ANTERIOR DESCENDING: No significant luminal narrowing or plaque formation and the distal LAD wraps around the apex. A dominant first diagonal branch is unremarkable in appearance. LEFT CIRCUMFLEX: No significant luminal narrowing or plaque formation. The dominant first obtuse marginal branch is unremarkable. The circumflex terminates is a small caliber vessel. RIGHT CORONARY ARTERY: No significant luminal narrowing and  gives off PDA and PLV branches. CARDIAC FUNCTION: Qualitatively normal resting wall motion and thickening with preserved systolic function; estimated ejection fraction is 54 %. End-diastolic volume is 124  mL. End-systolic volume is 58  mL. Stroke-volume is 67  mL . Cardiac output is 4.1 L/min. Myocardial mass is 124  g. AORTA: Nondilated. PERICARDIUM: Normal pericardial thickness. No pericardial effusion. NONCARDIAC FINDINGS: Visualized lungs, and mediastinum are unremarkable.     1. No significant coronary artery stenosis. 2. Left ventricular ejection fraction 54% . Fonnie Mu, DO  03/07/2021 1:51 PM            [facility]  NP Spectralink [phone] (8am-5pm)  MD Spectralink [phone] or [phone]  Dr Letitia Neri [phone] (8am-5pm)  After hours, non urgent consult line [phone]  After Hours, urgent consults [phone]

## 2021-03-08 NOTE — Discharge Instr - Diet (Signed)
Consistent carbohydrate diet

## 2021-03-08 NOTE — Plan of Care (Signed)
Problem: Safety  Goal: Patient will be free from injury during hospitalization  03/08/2021 1126 by Marca Ancona, RN  Outcome: Completed  03/08/2021 1050 by Marca Ancona, RN  Outcome: Progressing  Flowsheets (Taken 03/07/2021 2228 by Loreen Freud, RN)  Patient will be free from injury during hospitalization:   Assess patient's risk for falls and implement fall prevention plan of care per policy   Provide and maintain safe environment   Use appropriate transfer methods   Ensure appropriate safety devices are available at the bedside   Include patient/ family/ care giver in decisions related to safety   Hourly rounding  Goal: Patient will be free from infection during hospitalization  03/08/2021 1126 by Marca Ancona, RN  Outcome: Completed  03/08/2021 1050 by Marca Ancona, RN  Outcome: Progressing  Flowsheets (Taken 03/07/2021 1859)  Free from Infection during hospitalization:   Monitor lab/diagnostic results   Assess and monitor for signs and symptoms of infection   Monitor all insertion sites (i.e. indwelling lines, tubes, urinary catheters, and drains)   Encourage patient and family to use good hand hygiene technique     Problem: Pain  Goal: Pain at adequate level as identified by patient  03/08/2021 1126 by Marca Ancona, RN  Outcome: Completed  03/08/2021 1050 by Marca Ancona, RN  Outcome: Progressing  Flowsheets (Taken 03/07/2021 1859)  Pain at adequate level as identified by patient:   Identify patient comfort function goal   Assess for risk of opioid induced respiratory depression, including snoring/sleep apnea. Alert healthcare team of risk factors identified.   Assess pain on admission, during daily assessment and/or before any "as needed" intervention(s)   Reassess pain within 30-60 minutes of any procedure/intervention, per Pain Assessment, Intervention, Reassessment (AIR) Cycle   Evaluate if patient comfort function goal is met     Problem: Side Effects from Pain Analgesia  Goal: Patient will  experience minimal side effects of analgesic therapy  03/08/2021 1126 by Marca Ancona, RN  Outcome: Completed  03/08/2021 1050 by Marca Ancona, RN  Outcome: Progressing  Flowsheets (Taken 03/07/2021 1859)  Patient will experience minimal side effects of analgesic therapy:   Monitor/assess patient's respiratory status (RR depth, effort, breath sounds)   Assess for changes in cognitive function   Prevent/manage side effects per LIP orders (i.e. nausea, vomiting, pruritus, constipation, urinary retention, etc.)   Evaluate for opioid-induced sedation with appropriate assessment tool (i.e. POSS)     Problem: Discharge Barriers  Goal: Patient will be discharged home or other facility with appropriate resources  03/08/2021 1126 by Marca Ancona, RN  Outcome: Completed  03/08/2021 1050 by Marca Ancona, RN  Outcome: Progressing     Problem: Psychosocial and Spiritual Needs  Goal: Demonstrates ability to cope with hospitalization/illness  03/08/2021 1126 by Marca Ancona, RN  Outcome: Completed  03/08/2021 1050 by Marca Ancona, RN  Outcome: Progressing  Flowsheets (Taken 03/07/2021 1859)  Demonstrates ability to cope with hospitalizations/illness:   Encourage verbalization of feelings/concerns/expectations   Provide quiet environment   Assist patient to identify own strengths and abilities   Encourage patient to set small goals for self     Problem: Chest Pain  Goal: Vital signs and cardiac rhythm stable  03/08/2021 1126 by Marca Ancona, RN  Outcome: Completed  03/08/2021 1050 by Marca Ancona, RN  Outcome: Progressing  Flowsheets (Taken 03/07/2021 2228 by Loreen Freud, RN)  Vital signs and cardiac rhythm stable:   Monitor Craig Ford vital signs/cardiac rhythms   Monitor labs   Assess the need for  oxygen therapy and administer as ordered  Goal: Cardiac pain management  03/08/2021 1126 by Marca Ancona, RN  Outcome: Completed  03/08/2021 1050 by Marca Ancona, RN  Outcome: Progressing  Flowsheets (Taken 03/07/2021 2228 by  Loreen Freud, RN)  Cardiac pain management:   Assess/report chest pain/or related discomfort to LIP immediately   Instruct patient to report any change in pain status   Assess pain/or related discomfort on admission, during daily assessment, before and after any intervention   Include patient and patient care companion in decisions related to pain management  Goal: Anxiety management/effective coping  03/08/2021 1126 by Marca Ancona, RN  Outcome: Completed  03/08/2021 1050 by Marca Ancona, RN  Outcome: Progressing  Flowsheets (Taken 03/07/2021 2228 by Loreen Freud, RN)  Anxiety management/effective coping:   Assess/report uncontrolled anxiety, depression or ineffective coping to LIP   Offer reassurance to decrease anxiety   Encourage patient to immediately report any increase in anxiety and/or depression   Include patient in decision making of their care and give updates on their health status  Goal: Patient/Patient Care Companion demonstrates understanding of disease process, treatment plan, medications, and discharge plan  03/08/2021 1126 by Marca Ancona, RN  Outcome: Completed  03/08/2021 1050 by Marca Ancona, RN  Outcome: Progressing  Flowsheets (Taken 03/07/2021 1859)  Patient/Patient Care Companion demonstrates understanding of disease process, treatment plan, medications and discharge plan:   Educate patient to immediately report any chest pain/equivalent to RN   Reinforce with patient their activity level and to avoid Valsalva   Assist patient/patient care companion to identify measures for cardiac risk factor management   Consult/collaborate with Cardiac Rehabilitation   Assess need for smoking cessation and substance abuse counseling and refer as needed   Educate patient/patient care companion regarding identified learning needs

## 2021-03-10 LAB — ECG 12-LEAD
Atrial Rate: 76 {beats}/min
IHS MUSE NARRATIVE AND IMPRESSION: NORMAL
P Axis: 66 degrees
P-R Interval: 144 ms
Q-T Interval: 372 ms
QRS Duration: 76 ms
QTC Calculation (Bezet): 418 ms
R Axis: 57 degrees
T Axis: 54 degrees
Ventricular Rate: 76 {beats}/min

## 2021-08-17 ENCOUNTER — Ambulatory Visit: Payer: Self-pay | Admitting: Podiatry

## 2021-12-11 IMAGING — CT CT ABD-PELV W/ CM
2 of 5 series · 16 of 46 positions shown, 18 images · IV contrast (APPLIED)
Comparison: None.

CLINICAL DATA: Right upper quadrant abdominal pain radiating to the
back with diarrhea for the past 3 days.

EXAM:
CT ABDOMEN AND PELVIS WITH CONTRAST
TECHNIQUE: Multidetector CT imaging of the abdomen and pelvis was performed
using the standard protocol following bolus administration of
intravenous contrast.
CONTRAST:  100mL OMNIPAQUE IOHEXOL 350 MG/ML SOLN

[Series 2: axial st · axial · 0.84mm/px · z∈[-889,-429]mm · 13 of 104 slices shown, 15 images]
[im 6/104  soft-tissue]
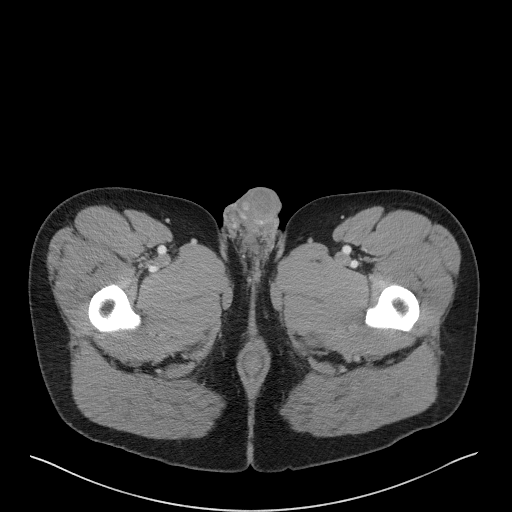
[im 6/104  bone]
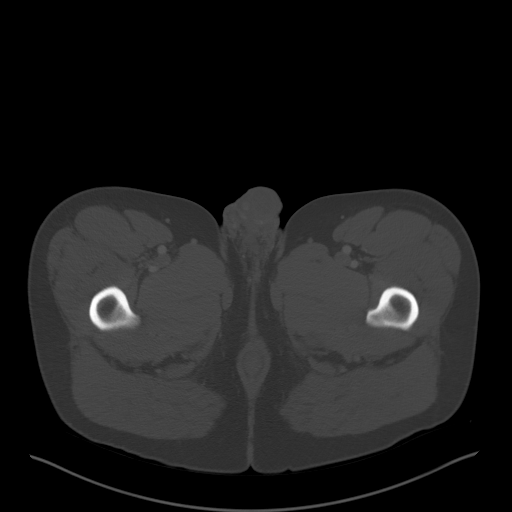
[im 16/104  soft-tissue]
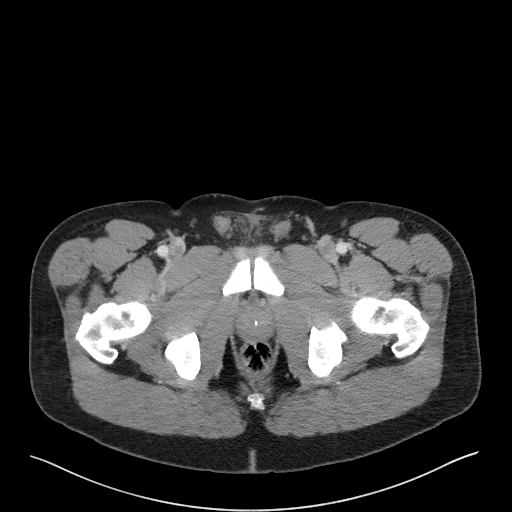
[im 21/104  soft-tissue]
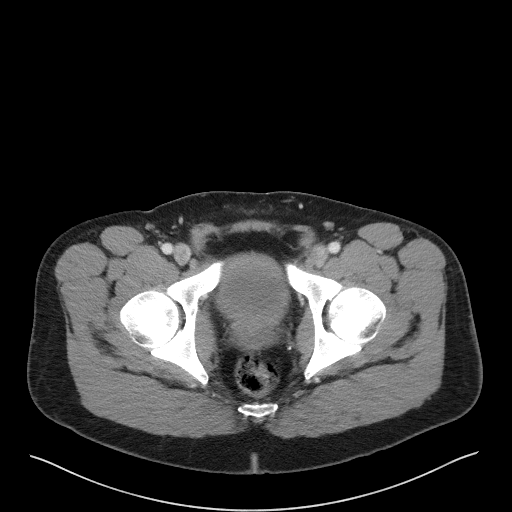
[im 31/104  soft-tissue]
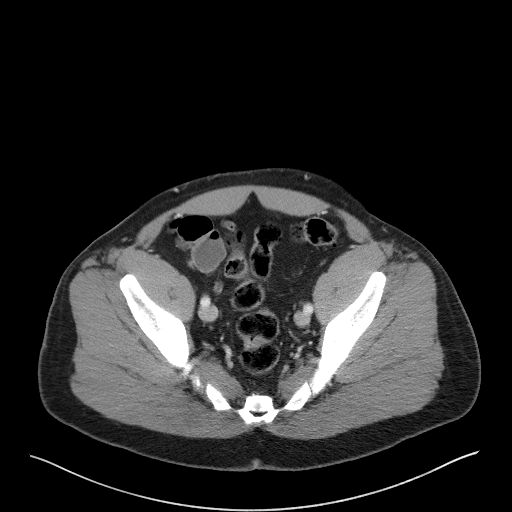
[im 37/104  soft-tissue]
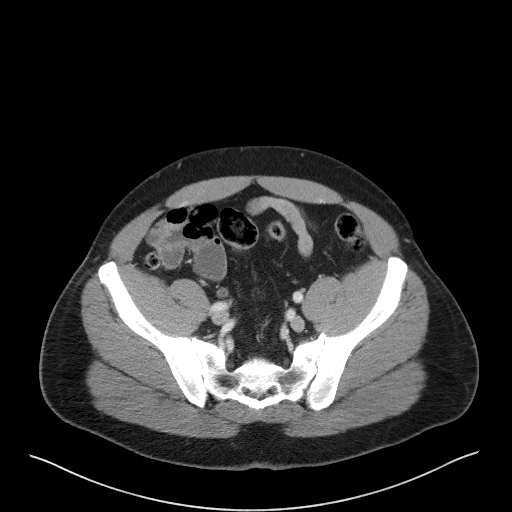
[im 47/104  soft-tissue]
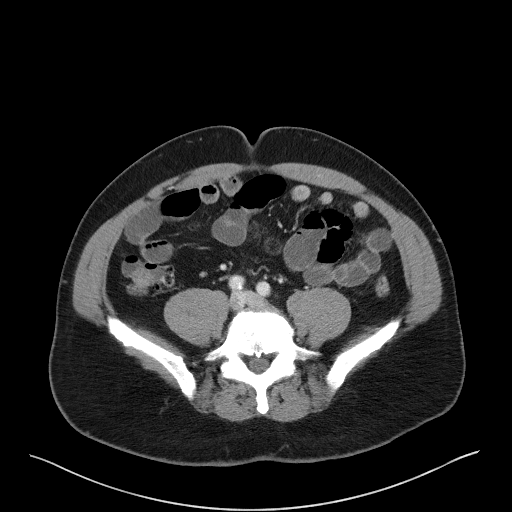
[im 52/104  soft-tissue]
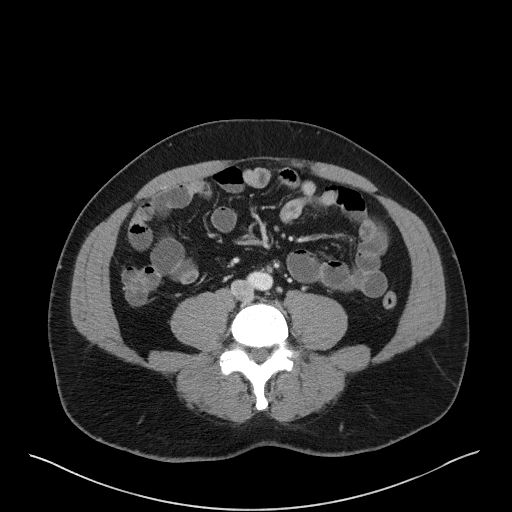
[im 57/104  soft-tissue]
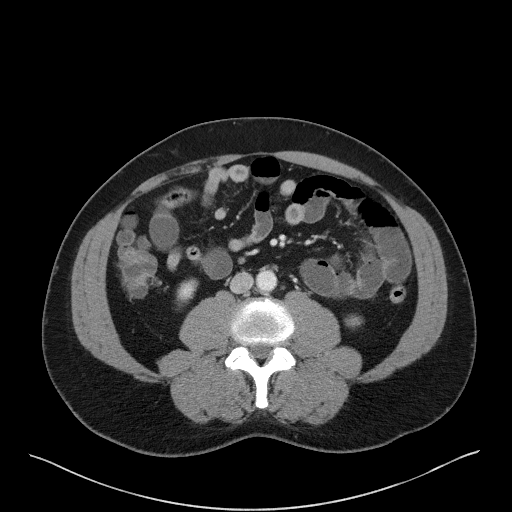
[im 67/104  soft-tissue]
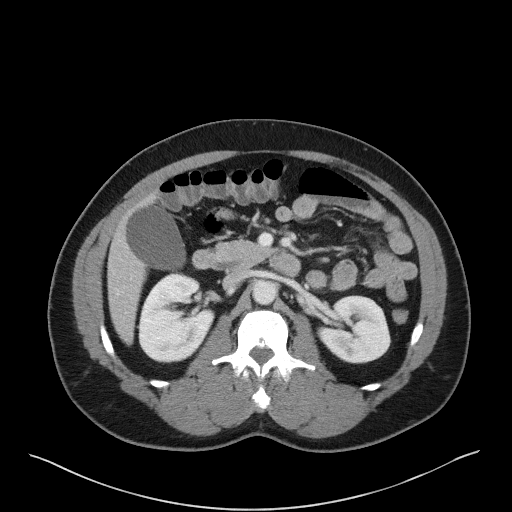
[im 67/104  bone]
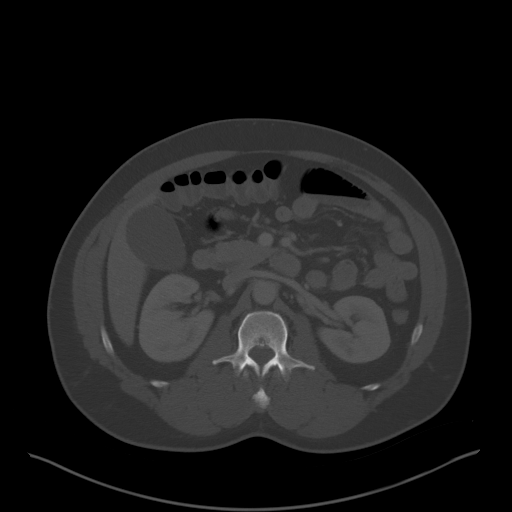
[im 73/104  soft-tissue]
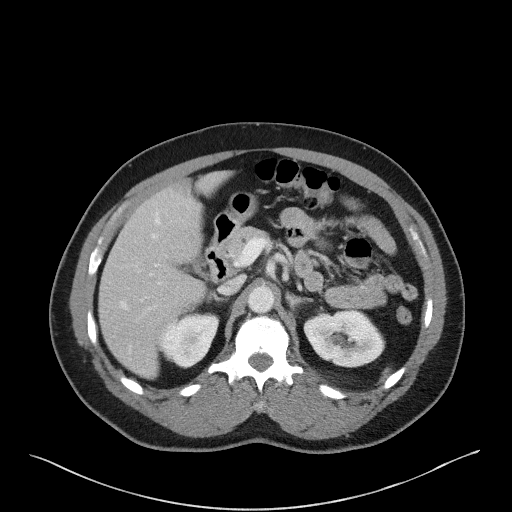
[im 83/104  soft-tissue]
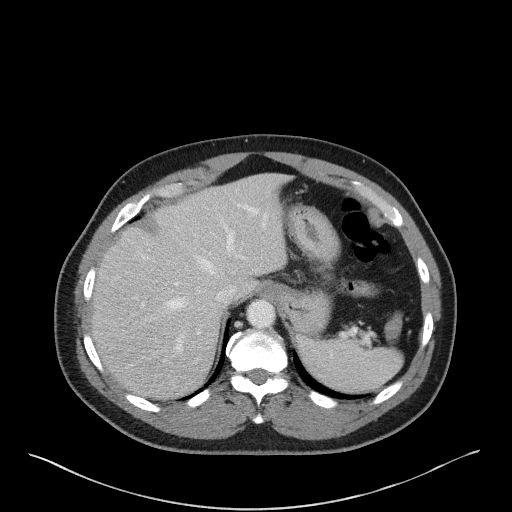
[im 88/104  soft-tissue]
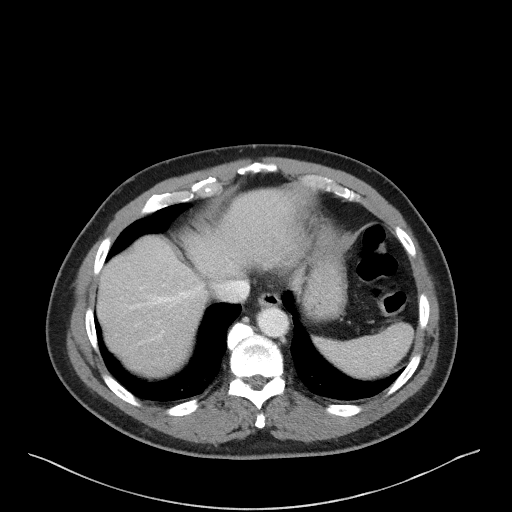
[im 98/104  soft-tissue]
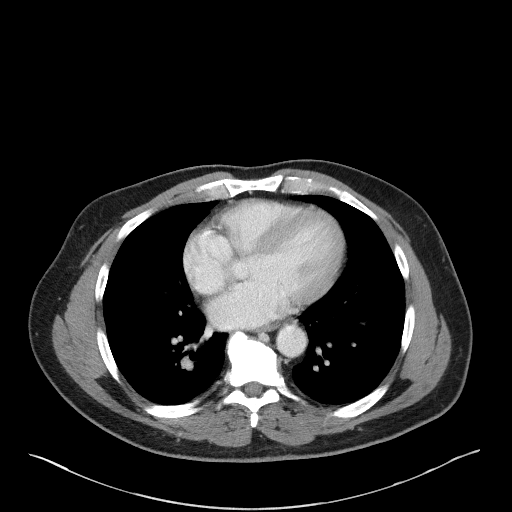

[Series 5: coronal st · coronal · 0.78mm/px · 3 of 93 slices shown]
[im 31/93  soft-tissue]
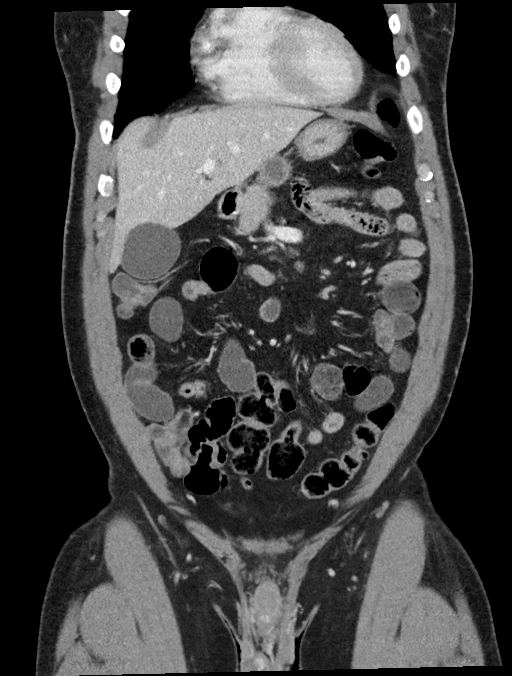
[im 41/93  soft-tissue]
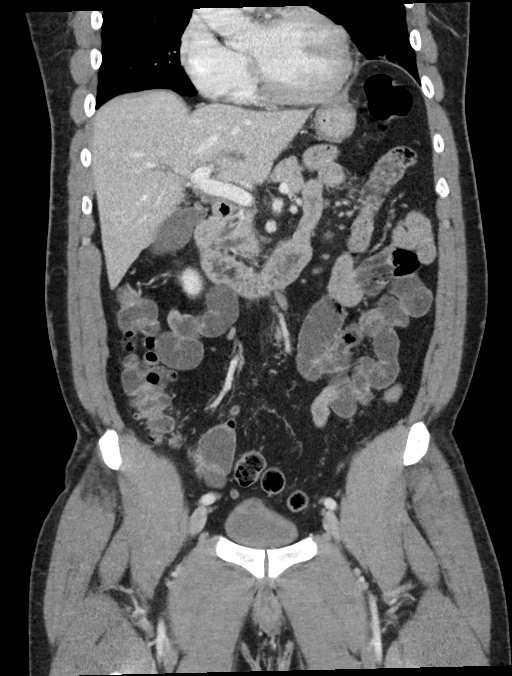
[im 52/93  soft-tissue]
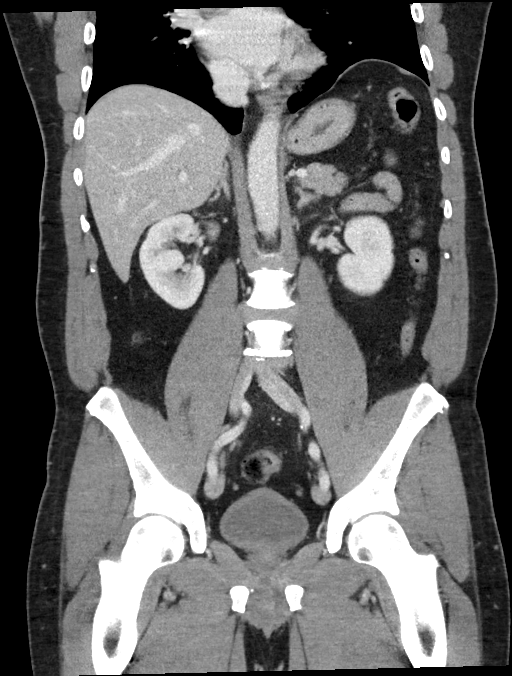

[16 of 46 positions shown; findings below may reference images not displayed]

FINDINGS: Lower chest: Minimal bilateral dependent atelectasis. Mildly
enlarged heart.

Hepatobiliary: No focal liver abnormality is seen. No gallstones,
gallbladder wall thickening, or biliary dilatation.

Pancreas: Unremarkable. No pancreatic ductal dilatation or
surrounding inflammatory changes.

Spleen: Normal in size without focal abnormality.

Adrenals/Urinary Tract: Mild diffuse bladder wall thickening. Small
cyst in each kidney. Normal appearing adrenal glands and ureters.

Stomach/Bowel: Small number of colon diverticula without evidence of
diverticulitis. Normal appearing appendix, stomach and small bowel.

Vascular/Lymphatic: Mild atheromatous arterial calcifications. No
enlarged lymph nodes.

Reproductive: Minimally enlarged prostate gland.

Other: No abdominal wall hernia or abnormality. No abdominopelvic
ascites.

Musculoskeletal: Lumbar and lower thoracic spine degenerative
changes.
IMPRESSION: 1. No acute abnormality.
2. Mild diffuse bladder wall thickening, compatible with chronic
bladder outlet obstruction by the mildly enlarged prostate gland.
3. Mild colonic diverticulosis.

## 2023-02-19 ENCOUNTER — Other Ambulatory Visit: Payer: Self-pay

## 2023-02-19 ENCOUNTER — Emergency Department
Admission: EM | Admit: 2023-02-19 | Discharge: 2023-02-19 | Disposition: A | Discharge status: Home / Self Care | Payer: Self-pay | Attending: Emergency Medicine | Admitting: Emergency Medicine

## 2023-02-19 ENCOUNTER — Emergency Department: Payer: Self-pay

## 2023-02-19 DIAGNOSIS — M19071 Primary osteoarthritis, right ankle and foot: Secondary | ICD-10-CM | POA: Insufficient documentation

## 2023-02-19 DIAGNOSIS — M79671 Pain in right foot: Secondary | ICD-10-CM

## 2023-02-19 MED ORDER — HYDROCODONE-ACETAMINOPHEN 5-325 MG PO TABS
1.0000 | ORAL_TABLET | Freq: Once | ORAL | Status: AC
  Administered 2023-02-19: 1 via ORAL
  Filled 2023-02-19: qty 1

## 2023-02-19 MED ORDER — DICLOFENAC SODIUM 50 MG PO TBEC: 50.0000 mg | DELAYED_RELEASE_TABLET | Freq: Two times a day (BID) | ORAL | 1 refills | Status: AC

## 2023-02-19 MED ORDER — CYCLOBENZAPRINE HCL 5 MG PO TABS: 5.0000 mg | ORAL_TABLET | Freq: Three times a day (TID) | ORAL | 0 refills | Status: AC | PRN

## 2023-02-19 NOTE — ED Triage Notes (Signed)
Pt to ED via POV from home. Pt reports having a knot on right foot that has been for 21yrs. Pt states the last few days right foot pain has gotten worse and feels like his foot is swollen.

## 2023-02-19 NOTE — ED Provider Notes (Signed)
[facility] Emergency Department Provider Note     Event Date/Time   First MD Initiated Contact with Patient 02/19/23 Jason Nash     (approximate)   History   Foot Pain   HPI  Jason Nash is a 62 y.o. male presents to the ED for evaluation of some intermittent persistent dorsal right foot pain has been present for the last 5 years.  Patient denies any recent injury, trauma, falls.  He would endorse pain pain to the dorsal base of the second toe, as well as concern for possible "knot" in the same region.   Physical Exam   Triage Vital Signs: ED Triage Vitals  Encounter Vitals Group     BP 02/19/23 1813 (!) 167/98     Systolic BP Percentile --      Diastolic BP Percentile --      Pulse Rate 02/19/23 1813 62     Resp 02/19/23 1813 18     Temp 02/19/23 1813 98.4 F (36.9 C)     Temp Source 02/19/23 1813 Oral     SpO2 02/19/23 1813 95 %     Weight --      Height --      Head Circumference --      Peak Flow --      Pain Score 02/19/23 1814 9     Pain Loc --      Pain Education --      Exclude from Growth Chart --     Most recent vital signs: Vitals:   02/19/23 1813 02/19/23 2007  BP: (!) 167/98 (!) 151/88  Pulse: 62 66  Resp: 18 18  Temp: 98.4 F (36.9 C)   SpO2: 95% 98%    General Awake, no distress. NAD CV:  Good peripheral perfusion.  RESP:  Normal effort.  ABD:  No distention.  MSK:  Right foot without obvious deformity, dislocation, or joint effusions.  Bunion deformity is appreciated.  Mildly tender to palpation over the base of the dorsal second MTP   ED Results / Procedures / Treatments   Labs (all labs ordered are listed, but only abnormal results are displayed) Labs Reviewed - No data to display   EKG   RADIOLOGY  I personally viewed and evaluated these images as part of my medical decision making, as well as reviewing the written report by the radiologist.  ED Provider Interpretation: Severe DJD with osteophytes  noted at the second MTP joint.  DG Foot Complete Right  Result Date: 02/19/2023 CLINICAL DATA:  Trauma to the right foot. EXAM: RIGHT FOOT COMPLETE - 3+ VIEW COMPARISON:  None Available. FINDINGS: There is no acute fracture or dislocation. The bones are well mineralized. There is severe arthritic changes of the second metatarsophalangeal joint with joint space narrowing and spurring. There is mild hallux valgus. The soft tissues are unremarkable. IMPRESSION: 1. No acute fracture or dislocation. 2. Severe arthritic changes of the second metatarsophalangeal joint. Electronically Signed   By: Elgie Collard M.D.   On: 02/19/2023 20:54     PROCEDURES:  Critical Care performed: No  Procedures   MEDICATIONS ORDERED IN ED: Medications  HYDROcodone-acetaminophen (NORCO/VICODIN) 5-325 MG per tablet 1 tablet (1 tablet Oral Given 02/19/23 2003)     IMPRESSION / MDM / ASSESSMENT AND PLAN / ED COURSE  I reviewed the triage vital signs and the nursing notes.  Differential diagnosis includes, but is not limited to, toe sprain, toe fracture, toe dislocation, gouty arthritis, DJD, OA  Patient's presentation is most consistent with acute complicated illness / injury requiring diagnostic workup.  Patient's diagnosis is consistent with acute on chronic right second toe pain secondary to severe DJD and osteophyte formation.  Findings confirm after my interpretation and review of images.  Patient will be discharged home with prescriptions for diclofenac and cyclobenzaprine. Patient is to follow up with [facility] podiatry as discussed, as needed or otherwise directed. Patient is given ED precautions to return to the ED for any worsening or new symptoms.     FINAL CLINICAL IMPRESSION(S) / ED DIAGNOSES   Final diagnoses:  Foot pain, right  Primary osteoarthritis of right foot     Rx / DC Orders   ED Discharge Orders          Ordered    diclofenac (VOLTAREN) 50 MG EC tablet  2  times daily        02/19/23 1927    cyclobenzaprine (FLEXERIL) 5 MG tablet  3 times daily PRN        02/19/23 1928             Note:  This document was prepared using Dragon voice recognition software and may include unintentional dictation errors.    Lissa Hoard, PA-C 02/19/23 2358    Corena Herter, MD 02/21/23 [id number]

## 2023-02-19 NOTE — Discharge Instructions (Addendum)
Your x-ray confirms osteoarthritis and bone spurs at the second toe joint.  This is a source of your pain and discomfort.  Take the pressure medicines as directed.  Follow-up with podiatry as discussed for ongoing evaluation management.
